# Patient Record
Sex: Male | Born: 1955 | Hispanic: No | Smoking: Current every day smoker
Health system: Southern US, Community
[De-identification: ages and names within clinical notes are randomized; demographics above are authoritative.]

## PROBLEM LIST (undated history)

## (undated) DIAGNOSIS — I10 Essential (primary) hypertension: Secondary | ICD-10-CM

## (undated) DIAGNOSIS — B1920 Unspecified viral hepatitis C without hepatic coma: Secondary | ICD-10-CM

---

## 1997-09-18 ENCOUNTER — Emergency Department (HOSPITAL_COMMUNITY): Admission: EM | Admit: 1997-09-18 | Discharge: 1997-09-18 | Payer: Self-pay | Admitting: Emergency Medicine

## 1999-12-28 ENCOUNTER — Emergency Department (HOSPITAL_COMMUNITY): Admission: EM | Admit: 1999-12-28 | Discharge: 1999-12-28 | Payer: Self-pay | Admitting: Emergency Medicine

## 2001-03-19 ENCOUNTER — Emergency Department (HOSPITAL_COMMUNITY): Admission: EM | Admit: 2001-03-19 | Discharge: 2001-03-19 | Payer: Self-pay | Admitting: Emergency Medicine

## 2003-06-11 ENCOUNTER — Emergency Department (HOSPITAL_COMMUNITY): Admission: AC | Admit: 2003-06-11 | Discharge: 2003-06-11 | Payer: Self-pay

## 2007-02-27 ENCOUNTER — Emergency Department (HOSPITAL_COMMUNITY): Admission: EM | Admit: 2007-02-27 | Discharge: 2007-02-28 | Payer: Self-pay | Admitting: Emergency Medicine

## 2011-03-14 LAB — I-STAT 8, (EC8 V) (CONVERTED LAB)
Acid-base deficit: 2
BUN: 5 — ABNORMAL LOW
Bicarbonate: 25.8 — ABNORMAL HIGH
Chloride: 106
Glucose, Bld: 95
HCT: 48
Hemoglobin: 16.3
Operator id: 192351
Potassium: 3.7
Sodium: 140
TCO2: 27
pCO2, Ven: 52 — ABNORMAL HIGH
pH, Ven: 7.303 — ABNORMAL HIGH

## 2011-03-14 LAB — POCT I-STAT CREATININE
Creatinine, Ser: 0.9
Operator id: 192351

## 2012-01-09 ENCOUNTER — Emergency Department (HOSPITAL_COMMUNITY): Payer: Medicaid Other

## 2012-01-09 ENCOUNTER — Emergency Department (HOSPITAL_COMMUNITY)
Admission: EM | Admit: 2012-01-09 | Discharge: 2012-01-09 | Disposition: A | Discharge status: Home / Self Care | Payer: Medicaid Other | Attending: Emergency Medicine | Admitting: Emergency Medicine

## 2012-01-09 ENCOUNTER — Encounter (HOSPITAL_COMMUNITY): Payer: Self-pay

## 2012-01-09 DIAGNOSIS — S0033XA Contusion of nose, initial encounter: Secondary | ICD-10-CM

## 2012-01-09 DIAGNOSIS — S0003XA Contusion of scalp, initial encounter: Secondary | ICD-10-CM | POA: Insufficient documentation

## 2012-01-09 DIAGNOSIS — F17200 Nicotine dependence, unspecified, uncomplicated: Secondary | ICD-10-CM | POA: Insufficient documentation

## 2012-01-09 DIAGNOSIS — S0083XA Contusion of other part of head, initial encounter: Secondary | ICD-10-CM | POA: Insufficient documentation

## 2012-01-09 MED ORDER — HYDROCODONE-ACETAMINOPHEN 5-325 MG PO TABS: 1.0000 | ORAL_TABLET | Freq: Four times a day (QID) | ORAL | Status: AC | PRN

## 2012-01-09 MED ORDER — NAPROXEN 500 MG PO TABS: 500.0000 mg | ORAL_TABLET | Freq: Two times a day (BID) | ORAL | Status: DC

## 2012-01-09 MED ORDER — HYDROCODONE-ACETAMINOPHEN 5-325 MG PO TABS
1.0000 | ORAL_TABLET | Freq: Once | ORAL | Status: AC
  Administered 2012-01-09: 1 via ORAL
  Filled 2012-01-09: qty 1
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
## 2012-03-28 ENCOUNTER — Emergency Department (HOSPITAL_COMMUNITY)
Admission: EM | Admit: 2012-03-28 | Discharge: 2012-03-28 | Disposition: A | Discharge status: Home / Self Care | Payer: Medicaid Other | Source: Home / Self Care | Attending: Emergency Medicine | Admitting: Emergency Medicine

## 2012-03-28 ENCOUNTER — Encounter (HOSPITAL_COMMUNITY): Payer: Self-pay

## 2012-03-28 ENCOUNTER — Emergency Department (INDEPENDENT_AMBULATORY_CARE_PROVIDER_SITE_OTHER): Payer: Medicaid Other

## 2012-03-28 DIAGNOSIS — M545 Low back pain, unspecified: Secondary | ICD-10-CM

## 2012-03-28 MED ORDER — CYCLOBENZAPRINE HCL 5 MG PO TABS: 5.0000 mg | ORAL_TABLET | Freq: Three times a day (TID) | ORAL | Status: DC | PRN

## 2012-03-28 MED ORDER — ACETAMINOPHEN-CODEINE #3 300-30 MG PO TABS: 1.0000 | ORAL_TABLET | Freq: Four times a day (QID) | ORAL | Status: DC | PRN

## 2012-03-28 NOTE — ED Provider Notes (Signed)
Medical screening examination/treatment/procedure(s) were performed by non-physician practitioner and as supervising physician I was immediately available for consultation/collaboration.  Raynald Blend, MD 03/28/12 [phone]
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
## 2012-04-27 ENCOUNTER — Emergency Department (HOSPITAL_COMMUNITY)
Admission: EM | Admit: 2012-04-27 | Discharge: 2012-04-27 | Disposition: A | Discharge status: Home / Self Care | Payer: Medicaid Other | Attending: Emergency Medicine | Admitting: Emergency Medicine

## 2012-04-27 ENCOUNTER — Emergency Department (HOSPITAL_COMMUNITY): Payer: Medicaid Other

## 2012-04-27 ENCOUNTER — Encounter (HOSPITAL_COMMUNITY): Payer: Self-pay | Admitting: *Deleted

## 2012-04-27 DIAGNOSIS — R062 Wheezing: Secondary | ICD-10-CM

## 2012-04-27 DIAGNOSIS — J029 Acute pharyngitis, unspecified: Secondary | ICD-10-CM

## 2012-04-27 DIAGNOSIS — R05 Cough: Secondary | ICD-10-CM | POA: Insufficient documentation

## 2012-04-27 DIAGNOSIS — R059 Cough, unspecified: Secondary | ICD-10-CM | POA: Insufficient documentation

## 2012-04-27 DIAGNOSIS — R61 Generalized hyperhidrosis: Secondary | ICD-10-CM | POA: Insufficient documentation

## 2012-04-27 DIAGNOSIS — I10 Essential (primary) hypertension: Secondary | ICD-10-CM

## 2012-04-27 DIAGNOSIS — F17200 Nicotine dependence, unspecified, uncomplicated: Secondary | ICD-10-CM | POA: Insufficient documentation

## 2012-04-27 MED ORDER — PANTOPRAZOLE SODIUM 40 MG PO TBEC: 40.0000 mg | DELAYED_RELEASE_TABLET | Freq: Every day | ORAL | Status: DC

## 2012-04-27 MED ORDER — GI COCKTAIL ~~LOC~~
30.0000 mL | Freq: Once | ORAL | Status: AC
  Administered 2012-04-27: 30 mL via ORAL
  Filled 2012-04-27: qty 30

## 2012-04-27 MED ORDER — ALBUTEROL SULFATE HFA 108 (90 BASE) MCG/ACT IN AERS
1.0000 | INHALATION_SPRAY | Freq: Four times a day (QID) | RESPIRATORY_TRACT | Status: DC | PRN
  Administered 2012-04-27: 2 via RESPIRATORY_TRACT
  Filled 2012-04-27: qty 6.7

## 2012-04-27 MED ORDER — PREDNISONE 20 MG PO TABS
40.0000 mg | ORAL_TABLET | Freq: Once | ORAL | Status: AC
  Administered 2012-04-27: 40 mg via ORAL
  Filled 2012-04-27: qty 2

## 2012-04-27 MED ORDER — PREDNISONE 20 MG PO TABS: 40.0000 mg | ORAL_TABLET | Freq: Every day | ORAL | Status: DC

## 2012-04-27 MED ORDER — AMLODIPINE BESYLATE 10 MG PO TABS: 10.0000 mg | ORAL_TABLET | Freq: Every day | ORAL | Status: DC

## 2012-04-27 NOTE — ED Notes (Signed)
Pt is here with throat pain for 3 weeks.  Red on inspection.  Pt states at night he has to clear his throat a lot.

## 2012-04-27 NOTE — Discharge Instructions (Signed)
Arterial Hypertension °Arterial hypertension (high blood pressure) is a condition of elevated pressure in your blood vessels. Hypertension over a long period of time is a risk factor for strokes, heart attacks, and heart failure. It is also the leading cause of kidney (renal) failure.  °CAUSES  °· In Adults -- Over 90% of all hypertension has no known cause. This is called essential or primary hypertension. In the other 10% of people with hypertension, the increase in blood pressure is caused by another disorder. This is called secondary hypertension. Important causes of secondary hypertension are: °· Heavy alcohol use. °· Obstructive sleep apnea. °· Hyperaldosterosim (Conn's syndrome). °· Steroid use. °· Chronic kidney failure. °· Hyperparathyroidism. °· Medications. °· Renal artery stenosis. °· Pheochromocytoma. °· Cushing's disease. °· Coarctation of the aorta. °· Scleroderma renal crisis. °· Licorice (in excessive amounts). °· Drugs (cocaine, methamphetamine). °Your caregiver can explain any items above that apply to you. °· In Children -- Secondary hypertension is more common and should always be considered. °· Pregnancy -- Few women of childbearing age have high blood pressure. However, up to 10% of them develop hypertension of pregnancy. Generally, this will not harm the woman. It may be a sign of 3 complications of pregnancy: preeclampsia, HELLP syndrome, and eclampsia. Follow up and control with medication is necessary. °SYMPTOMS  °· This condition normally does not produce any noticeable symptoms. It is usually found during a routine exam. °· Malignant hypertension is a late problem of high blood pressure. It may have the following symptoms: °· Headaches. °· Blurred vision. °· End-organ damage (this means your kidneys, heart, lungs, and other organs are being damaged). °· Stressful situations can increase the blood pressure. If a person with normal blood pressure has their blood pressure go up while being  seen by their caregiver, this is often termed "white coat hypertension." Its importance is not known. It may be related with eventually developing hypertension or complications of hypertension. °· Hypertension is often confused with mental tension, stress, and anxiety. °DIAGNOSIS  °The diagnosis is made by 3 separate blood pressure measurements. They are taken at least 1 week apart from each other. If there is organ damage from hypertension, the diagnosis may be made without repeat measurements. °Hypertension is usually identified by having blood pressure readings: °· Above 140/90 mmHg measured in both arms, at 3 separate times, over a couple weeks. °· Over 130/80 mmHg should be considered a risk factor and may require treatment in patients with diabetes. °Blood pressure readings over 120/80 mmHg are called "pre-hypertension" even in non-diabetic patients. °To get a true blood pressure measurement, use the following guidelines. Be aware of the factors that can alter blood pressure readings. °· Take measurements at least 1 hour after caffeine. °· Take measurements 30 minutes after smoking and without any stress. This is another reason to quit smoking  it raises your blood pressure. °· Use a proper cuff size. Ask your caregiver if you are not sure about your cuff size. °· Most home blood pressure cuffs are automatic. They will measure systolic and diastolic pressures. The systolic pressure is the pressure reading at the start of sounds. Diastolic pressure is the pressure at which the sounds disappear. If you are elderly, measure pressures in multiple postures. Try sitting, lying or standing. °· Sit at rest for a minimum of 5 minutes before taking measurements. °· You should not be on any medications like decongestants. These are found in many cold medications. °· Record your blood pressure readings and review   them with your caregiver. °If you have hypertension: °· Your caregiver may do tests to be sure you do not have  secondary hypertension (see "causes" above). °· Your caregiver may also look for signs of metabolic syndrome. This is also called Syndrome X or Insulin Resistance Syndrome. You may have this syndrome if you have type 2 diabetes, abdominal obesity, and abnormal blood lipids in addition to hypertension. °· Your caregiver will take your medical and family history and perform a physical exam. °· Diagnostic tests may include blood tests (for glucose, cholesterol, potassium, and kidney function), a urinalysis, or an EKG. Other tests may also be necessary depending on your condition. °PREVENTION  °There are important lifestyle issues that you can adopt to reduce your chance of developing hypertension: °· Maintain a normal weight. °· Limit the amount of salt (sodium) in your diet. °· Exercise often. °· Limit alcohol intake. °· Get enough potassium in your diet. Discuss specific advice with your caregiver. °· Follow a DASH diet (dietary approaches to stop hypertension). This diet is rich in fruits, vegetables, and low-fat dairy products, and avoids certain fats. °PROGNOSIS  °Essential hypertension cannot be cured. Lifestyle changes and medical treatment can lower blood pressure and reduce complications. The prognosis of secondary hypertension depends on the underlying cause. Many people whose hypertension is controlled with medicine or lifestyle changes can live a normal, healthy life.  °RISKS AND COMPLICATIONS  °While high blood pressure alone is not an illness, it often requires treatment due to its short- and long-term effects on many organs. Hypertension increases your risk for: °· CVAs or strokes (cerebrovascular accident). °· Heart failure due to chronically high blood pressure (hypertensive cardiomyopathy). °· Heart attack (myocardial infarction). °· Damage to the retina (hypertensive retinopathy). °· Kidney failure (hypertensive nephropathy). °Your caregiver can explain list items above that apply to you. Treatment  of hypertension can significantly reduce the risk of complications. °TREATMENT  °· For overweight patients, weight loss and regular exercise are recommended. Physical fitness lowers blood pressure. °· Mild hypertension is usually treated with diet and exercise. A diet rich in fruits and vegetables, fat-free dairy products, and foods low in fat and salt (sodium) can help lower blood pressure. Decreasing salt intake decreases blood pressure in a 1/3 of people. °· Stop smoking if you are a smoker. °The steps above are highly effective in reducing blood pressure. While these actions are easy to suggest, they are difficult to achieve. Most patients with moderate or severe hypertension end up requiring medications to bring their blood pressure down to a normal level. There are several classes of medications for treatment. Blood pressure pills (antihypertensives) will lower blood pressure by their different actions. Lowering the blood pressure by 10 mmHg may decrease the risk of complications by as much as 25%. °The goal of treatment is effective blood pressure control. This will reduce your risk for complications. Your caregiver will help you determine the best treatment for you according to your lifestyle. What is excellent treatment for one person, may not be for you. °HOME CARE INSTRUCTIONS  °· Do not smoke. °· Follow the lifestyle changes outlined in the "Prevention" section. °· If you are on medications, follow the directions carefully. Blood pressure medications must be taken as prescribed. Skipping doses reduces their benefit. It also puts you at risk for problems. °· Follow up with your caregiver, as directed. °· If you are asked to monitor your blood pressure at home, follow the guidelines in the "Diagnosis" section above. °SEEK MEDICAL CARE   HOME CARE INSTRUCTIONS     Do not smoke.   Follow the lifestyle changes outlined in the "Prevention" section.   If you are on medications, follow the directions carefully. Blood pressure medications must be taken as prescribed. Skipping doses reduces their benefit. It also puts you at risk for problems.   Follow up with your caregiver, as directed.   If you are asked to monitor your blood pressure at home, follow the guidelines in the "Diagnosis" section above.  SEEK MEDICAL CARE IF:     You think you are having medication side effects.   You have recurrent headaches or lightheadedness.   You have swelling in your ankles.   You  have trouble with your vision.  SEEK IMMEDIATE MEDICAL CARE IF:     You have sudden onset of chest pain or pressure, difficulty breathing, or other symptoms of a heart attack.   You have a severe headache.   You have symptoms of a stroke (such as sudden weakness, difficulty speaking, difficulty walking).  MAKE SURE YOU:     Understand these instructions.   Will watch your condition.   Will get help right away if you are not doing well or get worse.  Document Released: 05/20/2005 Document Revised: 08/12/2011 Document Reviewed: 12/18/2006  [employer] Patient Information 2013 [employer].

## 2012-04-27 NOTE — ED Notes (Signed)
C/O sore throat x 3 weeks. States started with a cold. Has had hoarseness for 2 weeks. Smokes 1ppd cigs and drinks two "40's" per day. States has had difficulty swallowing, especially solids.

## 2012-04-27 NOTE — ED Provider Notes (Addendum)
History    This chart was scribed for Jack King. Oletta Lamas, MD, MD by Smitty Pluck, ED Scribe. The patient was seen in room TR08C and the patient's care was started at 10:56AM.   CSN: [id number]  Arrival date & time 04/27/12  1039   None     Chief Complaint  Patient presents with  . sorethroat     (Consider location/radiation/quality/duration/timing/severity/associated sxs/prior treatment) The history is provided by the patient. No language interpreter was used.   Jack King is a 56 y.o. male who presents to the Emergency Department complaining of constant, moderate sore throat onset 3 weeks ago. Pt reports at night the pain is worse. He states that he coughs up phlegm sometimes. He states that he gets associated chest pain. Chest pain is not affected by exertion. He denies hx of acid reflux and reports that he normally eats spicy food. Swallowing aggravates the pain. He reports that he has diaphoresis at night and mentions having hoarseness. Reports using throat spray with minor relief. He denies fever, chills, nausea, vomiting, diarrhea, weight loss and any other pain. Pt states that he smokes cigarettes.   History reviewed. No pertinent past medical history.  History reviewed. No pertinent past surgical history.  History reviewed. No pertinent family history.  History  Substance Use Topics  . Smoking status: Current Every Day Smoker -- 2.0 packs/day  . Smokeless tobacco: Not on file  . Alcohol Use: Yes     Comment: daily      Review of Systems  Constitutional: Positive for diaphoresis. Negative for fever, chills and unexpected weight change.  HENT: Positive for sore throat, trouble swallowing and voice change. Negative for ear pain, congestion, rhinorrhea and sinus pressure.   Respiratory: Positive for cough and chest tightness. Negative for shortness of breath.   Cardiovascular: Positive for chest pain. Negative for palpitations and leg swelling.  Gastrointestinal:  Negative for nausea, vomiting and diarrhea.  Musculoskeletal: Negative for back pain.  Neurological: Negative for weakness.  All other systems reviewed and are negative.    Allergies  Review of patient's allergies indicates no known allergies.  Home Medications   Current Outpatient Rx  Name  Route  Sig  Dispense  Refill  . PHENOL 1.4 % MT LIQD   Mouth/Throat   Use as directed 2 sprays in the mouth or throat as needed. For sore throat         . AMLODIPINE BESYLATE 10 MG PO TABS   Oral   Take 1 tablet (10 mg total) by mouth daily.   30 tablet   0   . PANTOPRAZOLE SODIUM 40 MG PO TBEC   Oral   Take 1 tablet (40 mg total) by mouth daily.   14 tablet   0   . PREDNISONE 20 MG PO TABS   Oral   Take 2 tablets (40 mg total) by mouth daily.   14 tablet   0     BP 162/78  Pulse 68  Temp 97.4 F (36.3 C) (Oral)  Resp 18  SpO2 100%  Physical Exam  Nursing note and vitals reviewed. Constitutional: He is oriented to person, place, and time. He appears well-developed and well-nourished. No distress.  HENT:  Head: Normocephalic and atraumatic.  Right Ear: External ear normal.  Left Ear: External ear normal.  Mouth/Throat: Uvula is midline, oropharynx is clear and moist and mucous membranes are normal. No oropharyngeal exudate.       Generalized erythema likely due to irritation No  maxilla or frontal sinus tenderness  No edema No masses  No lesions  Eyes: EOM are normal.  Neck: Neck supple. No tracheal deviation present. No thyromegaly present.  Cardiovascular: Normal rate.   Pulmonary/Chest: Effort normal. No respiratory distress. He has wheezes in the right upper field.  Musculoskeletal: Normal range of motion.  Lymphadenopathy:    He has no cervical adenopathy.  Neurological: He is alert and oriented to person, place, and time.  Skin: Skin is warm and dry.  Psychiatric: He has a normal mood and affect. His behavior is normal.    ED Course  Procedures  (including critical care time) DIAGNOSTIC STUDIES: Oxygen Saturation is 99% on room air, normal by my interpretation.    COORDINATION OF CARE: 11:01 AM Discussed ED treatment with pt  11:01 AM Ordered:     . [COMPLETED] gi cocktail  30 mL Oral Once  . [COMPLETED] predniSONE  40 mg Oral Once       Labs Reviewed - No data to display Dg Chest 2 View  04/27/2012  *RADIOLOGY REPORT*  Clinical Data: Chest pain and throat pain.  CHEST - 2 VIEW  Comparison: Rib exam 02/27/2007  Findings: Two views of the chest demonstrate multiple old left rib fractures.  The lungs are clear bilaterally. Heart and mediastinum are within normal limits.  The trachea is midline.  IMPRESSION: No acute cardiopulmonary disease.  Old left rib fractures.   Original Report Authenticated By: Richarda Overlie, M.D.      1. Sore throat   2. Wheezing   3. Hypertension    12:43 PM CXR is clear, no masses, infiltrates, lesions per radiologist.  I reviewed 2 view CXR myself and interpret as no pneumonia, no cardiac border enlargement, no free air.    Will start on BP meds, steroids for inflammation which should help any slight wheeze as well as pharyngitis.  History is not consistent with strep or infection.    I reviewed several prior ED notes and consistently pt would have BP in the 150/90 range.  Today, has continued to get worse, will start on 10 mg norvasc and pt is told to recheck this with Dr. Concepcion Elk next week.   MDM  Pt with sore throat, hurts worse with swallowing, goes into his chest and feels worse at night time, has to clear phlegm a lot.  Pt smokes, endorses some night sweats, but no loss of appetite, unusual fatigue or weight loss.  No exertional CP or SOB, and symptoms not worse with exertion.  Doubt cardiac.  Oropharynx is somewhat irritated as well.  I suspect GERD or chronic cough may be contributing to symptoms.  Pt urged to stop smoking.    Will check CXR, treat for GERD and provide steroids and inhaler  for symptoms.      I personally performed the services described in this documentation, which was scribed in my presence. The recorded information has been reviewed and is accurate.     Jack King. Oletta Lamas, MD 04/27/12 1244  Jack King. Oletta Lamas, MD 04/27/12 1245  Jack King. Estil Vallee, MD 04/27/12 1246

## 2012-10-26 ENCOUNTER — Emergency Department (HOSPITAL_COMMUNITY)
Admission: EM | Admit: 2012-10-26 | Discharge: 2012-10-26 | Disposition: A | Discharge status: Home / Self Care | Payer: Medicaid Other | Attending: Emergency Medicine | Admitting: Emergency Medicine

## 2012-10-26 ENCOUNTER — Emergency Department (HOSPITAL_COMMUNITY): Payer: Medicaid Other

## 2012-10-26 DIAGNOSIS — Y998 Other external cause status: Secondary | ICD-10-CM | POA: Insufficient documentation

## 2012-10-26 DIAGNOSIS — Y92410 Unspecified street and highway as the place of occurrence of the external cause: Secondary | ICD-10-CM | POA: Insufficient documentation

## 2012-10-26 DIAGNOSIS — S51811A Laceration without foreign body of right forearm, initial encounter: Secondary | ICD-10-CM

## 2012-10-26 DIAGNOSIS — F17200 Nicotine dependence, unspecified, uncomplicated: Secondary | ICD-10-CM | POA: Insufficient documentation

## 2012-10-26 DIAGNOSIS — F1010 Alcohol abuse, uncomplicated: Secondary | ICD-10-CM | POA: Insufficient documentation

## 2012-10-26 DIAGNOSIS — Z23 Encounter for immunization: Secondary | ICD-10-CM | POA: Insufficient documentation

## 2012-10-26 DIAGNOSIS — S51809A Unspecified open wound of unspecified forearm, initial encounter: Secondary | ICD-10-CM | POA: Insufficient documentation

## 2012-10-26 DIAGNOSIS — F10929 Alcohol use, unspecified with intoxication, unspecified: Secondary | ICD-10-CM

## 2012-10-26 DIAGNOSIS — Z79899 Other long term (current) drug therapy: Secondary | ICD-10-CM | POA: Insufficient documentation

## 2012-10-26 MED ORDER — TETANUS-DIPHTH-ACELL PERTUSSIS 5-2.5-18.5 LF-MCG/0.5 IM SUSP
0.5000 mL | Freq: Once | INTRAMUSCULAR | Status: AC
  Administered 2012-10-26: 0.5 mL via INTRAMUSCULAR
  Filled 2012-10-26: qty 0.5

## 2012-10-26 MED ORDER — NAPROXEN 500 MG PO TABS: 500.0000 mg | ORAL_TABLET | Freq: Two times a day (BID) | ORAL | Status: DC

## 2012-10-26 MED ORDER — BACITRACIN 500 UNIT/GM EX OINT
1.0000 "application " | TOPICAL_OINTMENT | Freq: Two times a day (BID) | CUTANEOUS | Status: DC
  Filled 2012-10-26: qty 0.9

## 2012-10-26 NOTE — ED Notes (Signed)
MD Hyacinth Meeker at bedside performing sutures. Pt tolerating well. NAD.

## 2012-10-26 NOTE — ED Notes (Signed)
Pt states that son is coming to pick him up. Nurse first made aware. Pt will be placed in nurses sight to ensure that he doesn't attempt to drive. Pt made aware that he cannot drive due to being intoxicated. Pt reports "I don't even have anything to drive"

## 2012-10-26 NOTE — ED Notes (Signed)
Bacitracin applied to site, sterile guaze applied and site wrapped. Pt continues to deny pain. Follow up care provided.
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
## 2013-03-12 IMAGING — CR DG CHEST 2V
2 series · 2 of 2 positions shown · non-contrast
Comparison: Rib exam 02/27/2007

CLINICAL DATA: Chest pain and throat pain.

CHEST - 2 VIEW

[w chest pa]
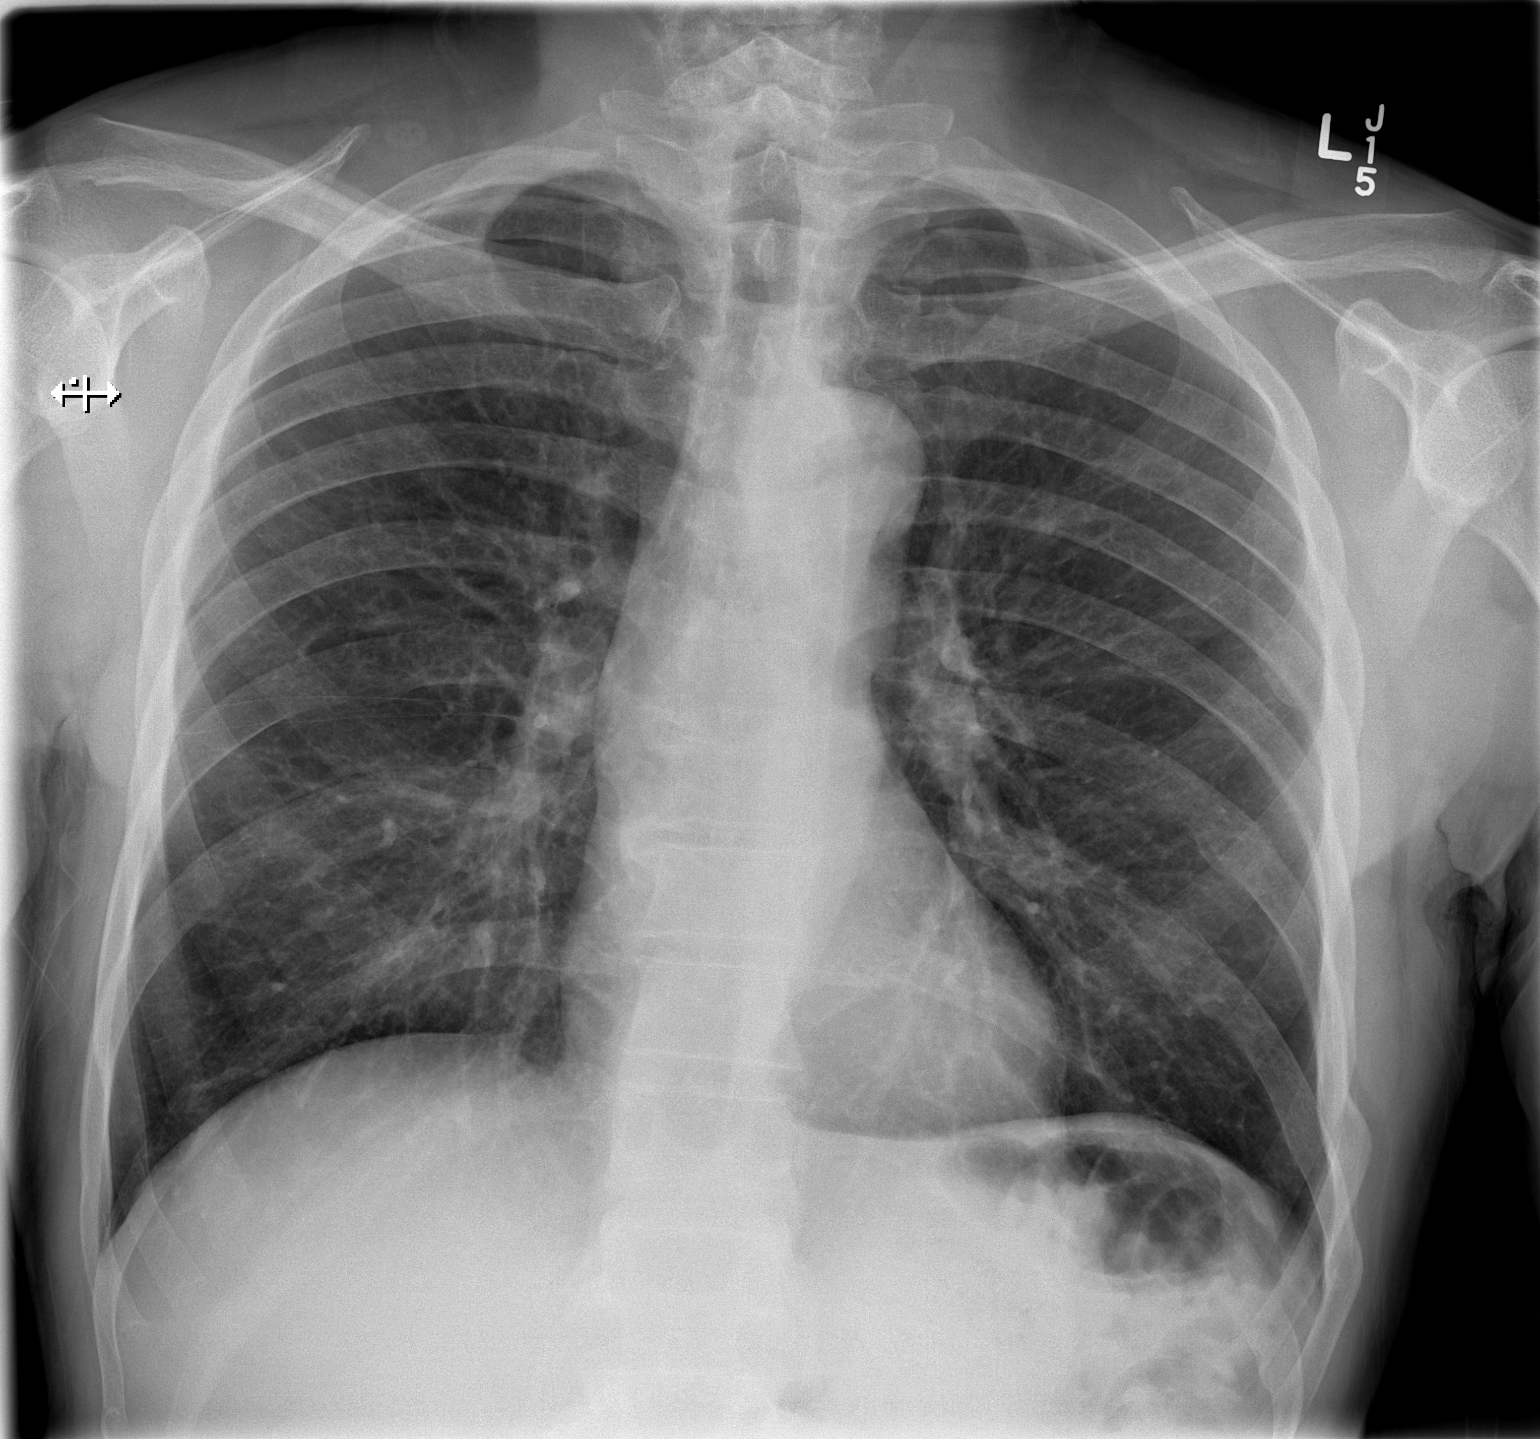

[w chest lat]
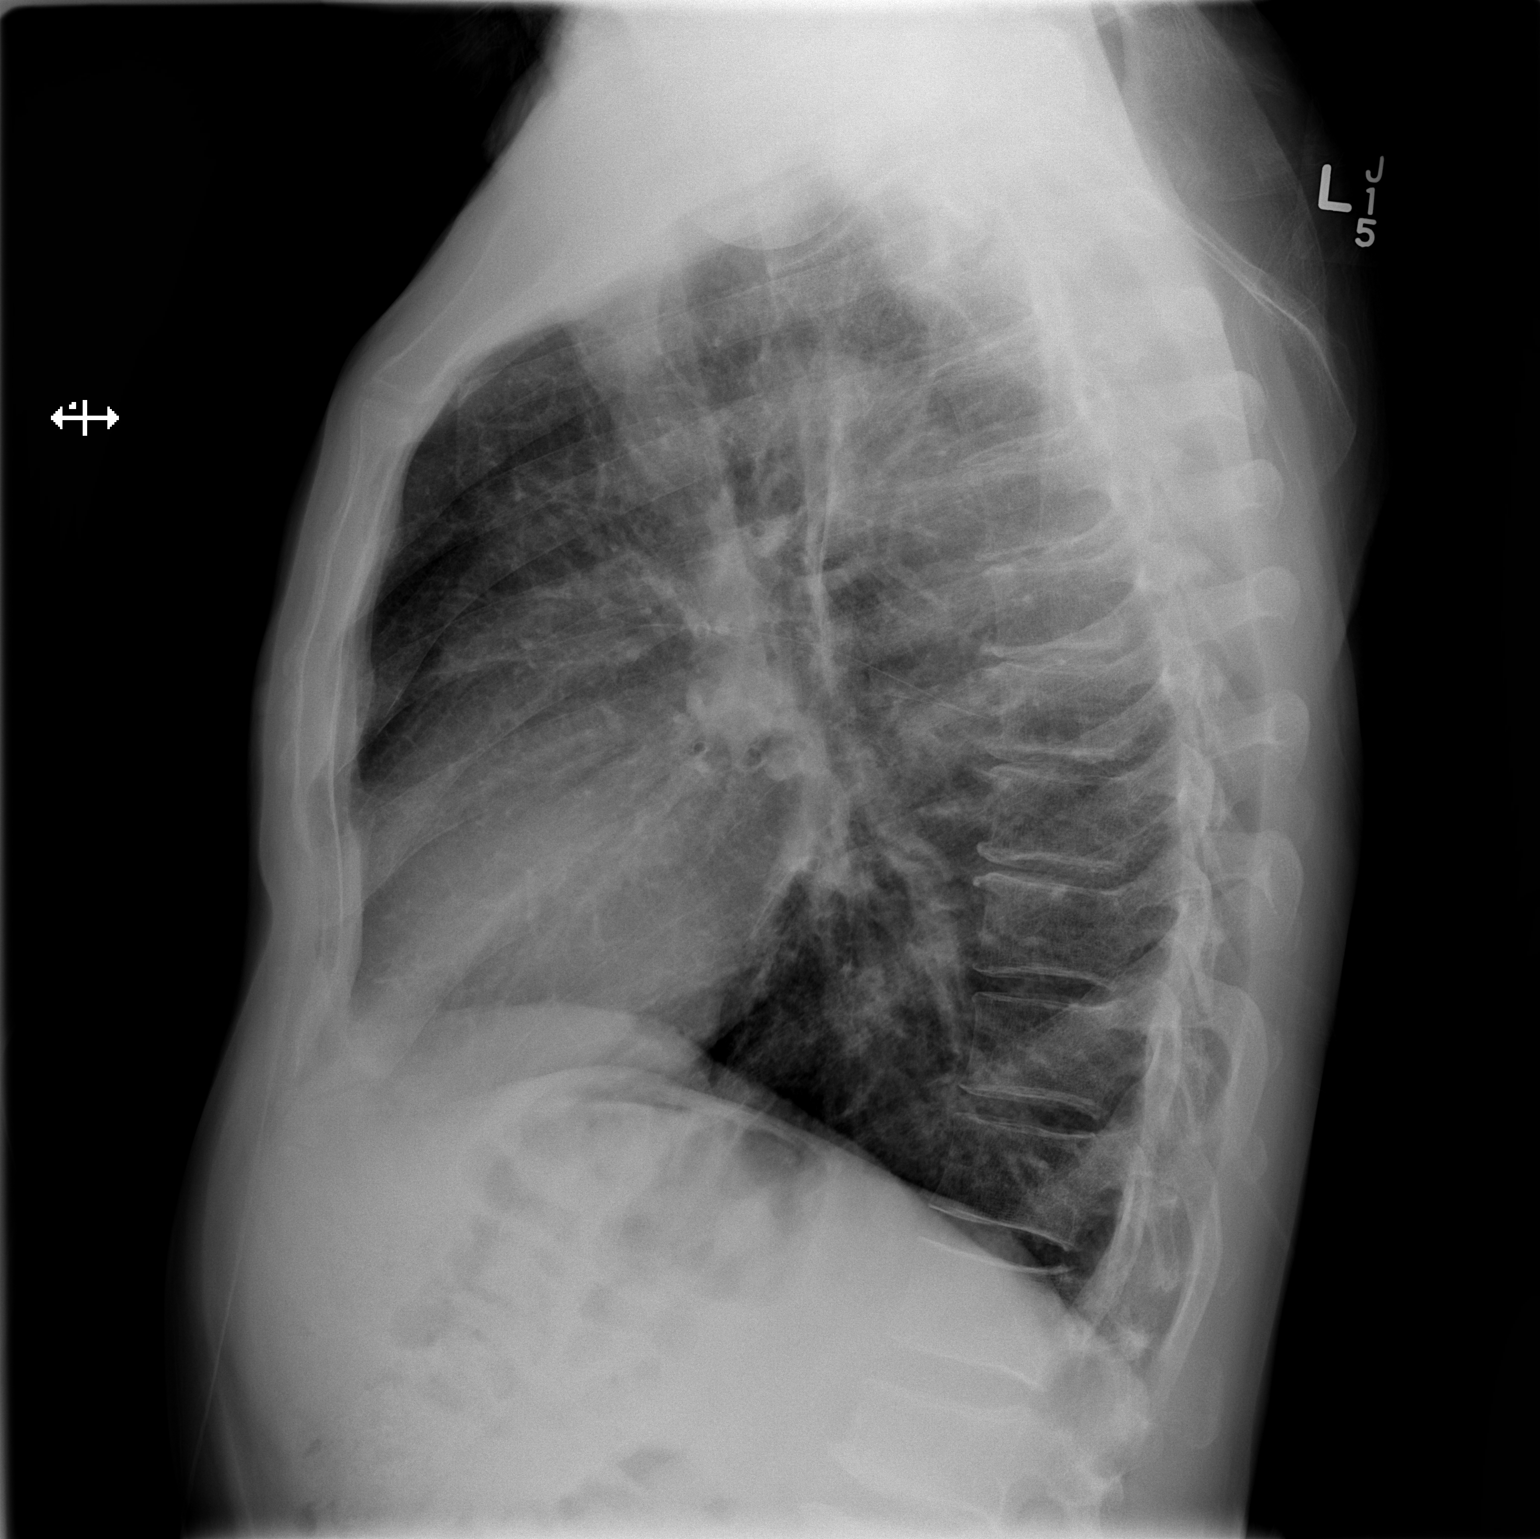

[2 of 2 positions shown; findings below may reference images not displayed]

FINDINGS: Two views of the chest demonstrate multiple old left rib
fractures.  The lungs are clear bilaterally. Heart and mediastinum
are within normal limits.  The trachea is midline.
IMPRESSION: No acute cardiopulmonary disease.

Old left rib fractures.

## 2013-07-22 ENCOUNTER — Emergency Department (HOSPITAL_COMMUNITY)
Admission: EM | Admit: 2013-07-22 | Discharge: 2013-07-22 | Disposition: A | Discharge status: Home / Self Care | Payer: Medicaid Other | Attending: Emergency Medicine | Admitting: Emergency Medicine

## 2013-07-22 ENCOUNTER — Other Ambulatory Visit: Payer: Self-pay

## 2013-07-22 ENCOUNTER — Encounter (HOSPITAL_COMMUNITY): Payer: Self-pay | Admitting: Emergency Medicine

## 2013-07-22 ENCOUNTER — Emergency Department (HOSPITAL_COMMUNITY): Payer: Medicaid Other

## 2013-07-22 DIAGNOSIS — I10 Essential (primary) hypertension: Secondary | ICD-10-CM | POA: Insufficient documentation

## 2013-07-22 DIAGNOSIS — R0789 Other chest pain: Secondary | ICD-10-CM

## 2013-07-22 DIAGNOSIS — Z79899 Other long term (current) drug therapy: Secondary | ICD-10-CM | POA: Insufficient documentation

## 2013-07-22 DIAGNOSIS — F17200 Nicotine dependence, unspecified, uncomplicated: Secondary | ICD-10-CM | POA: Insufficient documentation

## 2013-07-22 DIAGNOSIS — R0602 Shortness of breath: Secondary | ICD-10-CM | POA: Insufficient documentation

## 2013-07-22 DIAGNOSIS — R071 Chest pain on breathing: Secondary | ICD-10-CM | POA: Insufficient documentation

## 2013-07-22 LAB — COMPREHENSIVE METABOLIC PANEL
ALT: 35 U/L (ref 0–53)
AST: 36 U/L (ref 0–37)
Albumin: 4.1 g/dL (ref 3.5–5.2)
Alkaline Phosphatase: 90 U/L (ref 39–117)
BILIRUBIN TOTAL: 0.3 mg/dL (ref 0.3–1.2)
BUN: 8 mg/dL (ref 6–23)
CHLORIDE: 99 meq/L (ref 96–112)
CO2: 25 meq/L (ref 19–32)
CREATININE: 0.65 mg/dL (ref 0.50–1.35)
Calcium: 9.6 mg/dL (ref 8.4–10.5)
GLUCOSE: 82 mg/dL (ref 70–99)
Potassium: 4.1 mEq/L (ref 3.7–5.3)
Sodium: 139 mEq/L (ref 137–147)
Total Protein: 8.2 g/dL (ref 6.0–8.3)

## 2013-07-22 LAB — CBC
HEMATOCRIT: 45.8 % (ref 39.0–52.0)
Hemoglobin: 16.3 g/dL (ref 13.0–17.0)
MCH: 34 pg (ref 26.0–34.0)
MCHC: 35.6 g/dL (ref 30.0–36.0)
MCV: 95.4 fL (ref 78.0–100.0)
Platelets: 222 10*3/uL (ref 150–400)
RBC: 4.8 MIL/uL (ref 4.22–5.81)
RDW: 13.6 % (ref 11.5–15.5)
WBC: 6.4 10*3/uL (ref 4.0–10.5)

## 2013-07-22 LAB — I-STAT TROPONIN, ED: TROPONIN I, POC: 0 ng/mL (ref 0.00–0.08)

## 2013-07-22 NOTE — ED Provider Notes (Signed)
CSN: [id number]     Arrival date & time 07/22/13  1423 History   None    Chief Complaint  Patient presents with  . Chest Pain    HPI Comments: 58 yo M hx of HTN, 30 pack/year smoking hx come in with CC of chest pain.  Pt states symptoms started 2 days ago.  Pain is sharp, left sided, nonradiating, mild, lasting only a few seconds, resolves on own, with associated mild SOB.  Worse when laying on left side, and is nonpleuritic in nature.  Pt denies previous occurrence.  He denies fever, chills, diaphoresis, cough, abdominal pain, nausea, vomiting, diarrhea, constipation, myalgias, rash, or any other symptoms.  Pt is currently asymptomatic.  He takes 2 full ASA daily.  Has not really tried any medications for this pain specifically.  Denies hx of CAD, HLD, DMII.  Pt does has FMx of cardiac disease in mom, but not premature.  No other complaints currently.  The history is provided by the patient. No language interpreter was used.    History reviewed. No pertinent past medical history. History reviewed. No pertinent past surgical history. No family history on file. History  Substance Use Topics  . Smoking status: Current Every Day Smoker -- 2.00 packs/day  . Smokeless tobacco: Not on file  . Alcohol Use: Yes     Comment: daily    Review of Systems  Constitutional: Negative for fever, chills and diaphoresis.  Respiratory: Positive for shortness of breath. Negative for cough, wheezing and stridor.   Cardiovascular: Positive for chest pain. Negative for palpitations and leg swelling.  Gastrointestinal: Negative for nausea, vomiting, abdominal pain and diarrhea.  Musculoskeletal: Negative for myalgias.  Skin: Negative for rash.  Neurological: Negative for dizziness, weakness, light-headedness, numbness and headaches.  Hematological: Negative for adenopathy. Does not bruise/bleed easily.  All other systems reviewed and are negative.      Allergies  Review of patient's allergies indicates  no known allergies.  Home Medications   Current Outpatient Rx  Name  Route  Sig  Dispense  Refill  . amLODipine (NORVASC) 10 MG tablet   Oral   Take 1 tablet (10 mg total) by mouth daily.   30 tablet   0   . naproxen (NAPROSYN) 500 MG tablet   Oral   Take 1 tablet (500 mg total) by mouth 2 (two) times daily with a meal.   30 tablet   0    BP 143/86  Pulse 72  Temp(Src) 97.5 F (36.4 C)  Resp 18  SpO2 97% Physical Exam  Nursing note and vitals reviewed. Constitutional: He is oriented to person, place, and time. He appears well-developed and well-nourished.  HENT:  Head: Normocephalic and atraumatic.  Right Ear: External ear normal.  Left Ear: External ear normal.  Nose: Nose normal.  Mouth/Throat: Oropharynx is clear and moist.  Eyes: Conjunctivae and EOM are normal. Pupils are equal, round, and reactive to light.  Neck: Normal range of motion. Neck supple.  Cardiovascular: Normal rate, regular rhythm, normal heart sounds and intact distal pulses.   Pulmonary/Chest: Effort normal and breath sounds normal. No respiratory distress. He has no wheezes. He has no rales. He exhibits no tenderness.  Abdominal: Soft. Bowel sounds are normal. He exhibits no distension and no mass. There is no tenderness. There is no rebound and no guarding.  Musculoskeletal: Normal range of motion.  Neurological: He is alert and oriented to person, place, and time.  Skin: Skin is warm and dry.  ED Course  Procedures (including critical care time) Labs Review Labs Reviewed  CBC  COMPREHENSIVE METABOLIC PANEL  I-STAT TROPOININ, ED   Imaging Review Dg Chest 2 View  07/22/2013   CLINICAL DATA:  Left side chest pain and shortness of breath for 2 days, history smoking, hypertension  EXAM: CHEST  2 VIEW  COMPARISON:  04/27/2012  FINDINGS: Normal heart size, mediastinal contours, and pulmonary vascularity.  Minimal peribronchial thickening and hyperinflation.  No acute infiltrate, pleural  effusion or pneumothorax.  Old lower lateral left rib fractures noted.  IMPRESSION: Minimal chronic bronchitic changes.  No acute abnormalities.   Electronically Signed   By: Ulyses Southward M.D.   On: 07/22/2013 14:52    EKG Interpretation    Date/Time:  Thursday July 22 2013 14:28:39 EST Ventricular Rate:  71 PR Interval:  184 QRS Duration: 84 QT Interval:  400 QTC Calculation: 434 R Axis:   43 Text Interpretation:  Normal sinus rhythm Septal infarct , age undetermined Abnormal ECG Confirmed by Bebe Shaggy  MD, DONALD [phone]) on 07/22/2013 3:12:15 PM            MDM   Final diagnoses:  None   58 yo M hx of HTN, 30 pack/year smoking hx come in with CC of chest pain.   Filed Vitals:   07/22/13 1501  BP: 146/80  Pulse:   Temp:   Resp: 20   Physical exam as above.  Lungs CTAB, no wheezing, rales, or rhonchi.  No cardiac murmurs.  Regular rate and rhythm.  No reproducible chest TTP.  No abdominal pain.  Radial and femoral pulses equal bilaterally.  EKG as above, NSR, unconcerning for ACS.  CXR minimal peribronchial thickening, old left lateral rib fractures.  CBC, CMP are both WNL.  Troponin 0.00.    Unlikely ACS, given atypical history, presentation.    Pt to be d/c home in good condition.  Encouraged to continue supportive care.  F/u with PCP in 1 week.  Return precautions given.  Pt understands and agrees with plan.  I have discussed pt's care plan with Dr. Bebe Shaggy.  Jon Gills, MD     Jon Gills, MD 07/23/13 Lyda Jester

## 2013-07-22 NOTE — ED Notes (Signed)
Pt c/o intermittent left lower chest pain, started 2 night ago. Reports he was laying down when it started. Denies feeling lightheaded/dizzy/nauseous with it. sts he did experience some sob. Pt denies pain at this time. Reports having about 10 episodes last night, each lasts approximately 3 mins at the longest. sts the pain goes away on its own. Nad, skin warm and dry, resp e/u.

## 2013-07-22 NOTE — ED Notes (Signed)
Pt returned from radiology.

## 2013-07-22 NOTE — Discharge Instructions (Signed)

## 2013-07-22 NOTE — ED Notes (Signed)
Left side cp x 3 days  Happens at night esp when he lays on left side some  sob

## 2013-07-23 NOTE — ED Provider Notes (Signed)
I have personally seen and examined the patient.  I have discussed the plan of care with the resident.  I have reviewed the documentation on PMH/FH/Soc. History.  I have reviewed the documentation of the resident and agree.  I have reviewed and agree with the ECG interpretation(s) documented by the resident.   Pt well appearing, no distress, he describes to me brief CP, suspicion for ACS/Pe/Dissection is low, stable for d/c home   Joya Gaskinsonald W Verne Lanuza, MD 07/23/13 1004

## 2013-10-10 ENCOUNTER — Emergency Department (HOSPITAL_COMMUNITY): Payer: Medicaid Other

## 2013-10-10 ENCOUNTER — Emergency Department (HOSPITAL_COMMUNITY)
Admission: EM | Admit: 2013-10-10 | Discharge: 2013-10-11 | Disposition: A | Discharge status: Home / Self Care | Payer: Medicaid Other | Attending: Emergency Medicine | Admitting: Emergency Medicine

## 2013-10-10 ENCOUNTER — Encounter (HOSPITAL_COMMUNITY): Payer: Self-pay | Admitting: Emergency Medicine

## 2013-10-10 DIAGNOSIS — Y939 Activity, unspecified: Secondary | ICD-10-CM | POA: Insufficient documentation

## 2013-10-10 DIAGNOSIS — R0781 Pleurodynia: Secondary | ICD-10-CM

## 2013-10-10 DIAGNOSIS — J449 Chronic obstructive pulmonary disease, unspecified: Secondary | ICD-10-CM | POA: Insufficient documentation

## 2013-10-10 DIAGNOSIS — S298XXA Other specified injuries of thorax, initial encounter: Secondary | ICD-10-CM | POA: Insufficient documentation

## 2013-10-10 DIAGNOSIS — F17200 Nicotine dependence, unspecified, uncomplicated: Secondary | ICD-10-CM | POA: Insufficient documentation

## 2013-10-10 DIAGNOSIS — Z7982 Long term (current) use of aspirin: Secondary | ICD-10-CM | POA: Insufficient documentation

## 2013-10-10 DIAGNOSIS — W1809XA Striking against other object with subsequent fall, initial encounter: Secondary | ICD-10-CM | POA: Insufficient documentation

## 2013-10-10 DIAGNOSIS — J4489 Other specified chronic obstructive pulmonary disease: Secondary | ICD-10-CM | POA: Insufficient documentation

## 2013-10-10 DIAGNOSIS — I10 Essential (primary) hypertension: Secondary | ICD-10-CM | POA: Insufficient documentation

## 2013-10-10 DIAGNOSIS — Z79899 Other long term (current) drug therapy: Secondary | ICD-10-CM | POA: Insufficient documentation

## 2013-10-10 DIAGNOSIS — Y929 Unspecified place or not applicable: Secondary | ICD-10-CM | POA: Insufficient documentation

## 2013-10-10 HISTORY — DX: Essential (primary) hypertension: I10

## 2013-10-10 MED ORDER — ACETAMINOPHEN 325 MG PO TABS
650.0000 mg | ORAL_TABLET | Freq: Once | ORAL | Status: AC
  Administered 2013-10-11: 650 mg via ORAL
  Filled 2013-10-10: qty 2
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
## 2013-10-10 NOTE — ED Notes (Signed)
Gave report to Melissa
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
## 2013-10-10 NOTE — Discharge Instructions (Signed)
Take Tylenol or Ibuprofen for pain Continue deep breathing  Return to the emergency department if you develop any changing/worsening condition, chest pain, difficulty breathing, coughing up blood, fever or any other concerns (please read additional information regarding your condition below)     Rib Contusion A rib contusion (bruise) can occur by a blow to the chest or by a fall against a hard object. Usually these will be much better in a couple weeks. If X-rays were taken today and there are no broken bones (fractures), the diagnosis of bruising is made. However, broken ribs may not show up for several days, or may be discovered later on a routine X-ray when signs of healing show up. If this happens to you, it does not mean that something was missed on the X-ray, but simply that it did not show up on the first X-rays. Earlier diagnosis will not usually change the treatment. HOME CARE INSTRUCTIONS   Avoid strenuous activity. Be careful during activities and avoid bumping the injured ribs. Activities that pull on the injured ribs and cause pain should be avoided, if possible.  For the first day or two, an ice pack used every 20 minutes while awake may be helpful. Put ice in a plastic bag and put a towel between the bag and the skin.  Eat a normal, well-balanced diet. Drink plenty of fluids to avoid constipation.  Take deep breaths several times a day to keep lungs free of infection. Try to cough several times a day. Splint the injured area with a pillow while coughing to ease pain. Coughing can help prevent pneumonia.  Wear a rib belt or binder only if told to do so by your caregiver. If you are wearing a rib belt or binder, you must do the breathing exercises as directed by your caregiver. If not used properly, rib belts or binders restrict breathing which can lead to pneumonia.  Only take over-the-counter or prescription medicines for pain, discomfort, or fever as directed by your  caregiver. SEEK MEDICAL CARE IF:   You or your child has an oral temperature above 102 F (38.9 C).  Your baby is older than 3 months with a rectal temperature of 100.5 F (38.1 C) or higher for more than 1 day.  You develop a cough, with thick or bloody sputum. SEEK IMMEDIATE MEDICAL CARE IF:   You have difficulty breathing.  You feel sick to your stomach (nausea), have vomiting or belly (abdominal) pain.  You have worsening pain, not controlled with medications, or there is a change in the location of the pain.  You develop sweating or radiation of the pain into the arms, jaw or shoulders, or become light headed or faint.  You or your child has an oral temperature above 102 F (38.9 C), not controlled by medicine.  Your or your baby is older than 3 months with a rectal temperature of 102 F (38.9 C) or higher.  Your baby is 20 months old or younger with a rectal temperature of 100.4 F (38 C) or higher. MAKE SURE YOU:   Understand these instructions.  Will watch your condition.  Will get help right away if you are not doing well or get worse. Document Released: 02/12/2001 Document Revised: 09/14/2012 Document Reviewed: 01/06/2008 [facility] Patient Information 2014 [place], [place].   Emergency Department Resource Guide 1) Find a Doctor and Pay Out of Pocket Although you won't have to find out who is covered by your insurance plan, it is a good idea to  ask around and get recommendations. You will then need to call the office and see if the doctor you have chosen will accept you as a new patient and what types of options they offer for patients who are self-pay. Some doctors offer discounts or will set up payment plans for their patients who do not have insurance, but you will need to ask so you aren't surprised when you get to your appointment.  2) Contact Your Local Health Department Not all health departments have doctors that can see patients for sick visits, but many do,  so it is worth a call to see if yours does. If you don't know where your local health department is, you can check in your phone book. The CDC also has a tool to help you locate your state's health department, and many state websites also have listings of all of their local health departments.  3) Find a Walk-in Clinic If your illness is not likely to be very severe or complicated, you may want to try a walk in clinic. These are popping up all over the country in pharmacies, drugstores, and shopping centers. They're usually staffed by nurse practitioners or physician assistants that have been trained to treat common illnesses and complaints. They're usually fairly quick and inexpensive. However, if you have serious medical issues or chronic medical problems, these are probably not your best option.  No Primary Care Doctor: - Call Health Connect at  [phone] - they can help you locate a primary care doctor that  accepts your insurance, provides certain services, etc. - Physician Referral Service- [phone]  Chronic Pain Problems: Organization         Address  Phone   Notes  [facility]  [phone] Patients need to be referred by their primary care doctor.   Medication Assistance: Organization         Address  Phone   Notes  [facility] Medication [facility] [address] [place], [place] [address] [phone] --Must be a resident of [facility] -- Must have NO insurance coverage whatsoever (no Medicaid/ Medicare, etc.) -- The pt. MUST have a primary care doctor that directs their care regularly and follows them in the community   Med[facility]ist  [phone]   [employer]  [phone]    Agencies that provide inexpensive medical care: Organization         Address  Phone   Notes  [facility]  [phone]   [facility]    [phone]   [facility] [address] [place], [place] [address] [phone]   [facility] [address], [place] [phone]   Planned Parenthood    [phone]   [facility]    [phone]   Community Health and [facility]  [address], [place] Phone:  [phone], Fax:  [phone] Hours of Operation:  9 am - 6 pm, M-F.  Also accepts Medicaid/Medicare and self-pay.  [facility]  [address], [place] Phone: [phone], Fax: [phone]. Hours of Operation:  8:30 am - 5:30 pm, M-F.  Also accepts Medicaid and self-pay.  [facility] [place] [address] [place] Phone: [phone]   [facility] [address] Natasha Bence [place], [place] [phone], Ext. [phone] Mondays & Thursdays: 7-9 AM.  First 15 patients are  seen on a first come, first serve basis.    Medicaid-accepting [facility] Providers:  Organization         Address  Phone   Notes  [facility] [address], [place] [phone] Also accepts self-pay patients.  [facility] [address] [place], [place]  [phone]   [facility] [address], [place] [phone]) [phone]   [facility] [address] [place] [address], [place] [phone]) [phone]   Renaye RakersVeita Bland [address], [place]   [phone]) [phone] Only accepts [place] Access [place] patients after they have their name applied to their card.   Self-Pay (no insurance) in [facility]:  Organization         Address  Phone   Notes  Sickle Cell Patients, [facility] [address], [place] [phone]) [phone]   [facility] [address], [place] [phone]   [facility]  [address],  [phone]   [facility]/Dr. Osei-Bonsu  [address] [place] [address], [place] or  [phone], [place] [phone]) [phone] Phone number for both [place] and [place] locations is the same.  [facility] [address], [place] [phone]) [phone]   [facility] [place] [address] [place] [address], [place] or [address] [phone]) [phone]   [facility] [address], [place] [phone]) [phone], phone; [phone], fax Sees patients 1st and 3rd Saturday of every month.  Must not qualify for public or private insurance (i.e. Medicaid, Medicare, [place] Health Choice, Veterans' Benefits)  Household income should be no more than 200% of the poverty level The clinic cannot treat you if you are pregnant or think you are pregnant  Sexually transmitted diseases are not treated at the clinic.    Dental Care: Organization         Address  Phone  Notes  [facility] of [facility] [address], [place] [phone]) [phone] Accepts children up to age 57 who are enrolled in [place] or [place] Health Choice; pregnant women with a Medicaid card; and children who have applied for Medicaid or [place] Health Choice, but were declined, whose parents can pay a reduced fee at time of service.  [facility] Department of [facility] [place]  [address], [place] [phone]) [phone] Accepts children up to age 57 who are enrolled in [place] or [place] Health Choice; pregnant women with a Medicaid card; and children who have applied for Medicaid or [place] Health Choice, but were declined, whose parents can pay a reduced fee at time of service.  [facility]  [address], [place] [phone] Patients are seen by appointment only. Walk-ins are not accepted. [facility] will see patients 57 years of age and older. Monday - Tuesday (8am-5pm) Most Wednesdays (8:30-5pm) $30 per visit, cash only  [facility]  [address], [facility]  [place] [phone] Patients are seen by appointment only. Walk-ins are not accepted. [facility] will see patients 57 years of age and older. One Wednesday Evening (Monthly: Volunteer Based).  $30 per visit, cash only  [employer]  [phone]) [phone] for adults; Children under age 374, call Graduate Pediatric Dentistry at [phone]  [phone]. Children aged 73-14, please call [phone] to request a pediatric application.  Dental services are provided in all areas of dental care including fillings, crowns and bridges, complete and partial dentures, implants, gum treatment, root canals, and extractions. Preventive care is also provided. Treatment is provided to both adults and children. Patients are selected via a lottery and there is often a waiting list.   [facility] [address] [place]  [phone] [email or web address]   [facility] [address] [place], [place] [phone], Ext. [phone] Second and Fourth Thursday of each month, opens at 6:30 AM; Clinic ends at 9 AM.  Patients are seen on a first-come first-served basis, and a limited number are seen during each clinic.   [facility]  [address] [place] [place], [place] [phone]   Eligibility Requirements You must have lived in [place], [place], or [place] counties for at least the last three months.   You cannot be eligible for state or federal sponsored [place], including [employer], [place], or [employer].   You generally cannot be eligible for healthcare insurance through your employer.    How to apply: Eligibility screenings are held every Tuesday and Wednesday afternoon from 1:00 pm until 4:00 pm. You do not need an appointment for the interview!  [facility] [address], [place], [place] [phone]   [facility] Health Department  [phone]   [facility] Health Department  [phone]   [facility]  [phone]    Behavioral Health Resources in the Community: Intensive Outpatient Programs Organization         Address  Phone  Notes  [facility] [address] [place], [place] [phone]   [facility] Outpatient [address] [place], [place] [phone]   [facility]: [facility] [address] [place], [place]  [phone]   [facility] Mental Health [address],  [place], [place] [phone] or [phone]   Substance Abuse Resources Organization         Address  Phone  Notes  Al[facility] Drug Services  [phone]   [facility]  [phone]   [facility] [place]  [phone]   Floydene Flock  [phone]   Residential & Outpatient Substance Abuse Program  [phone]   Psychological Services Organization         Address  Phone  Notes  [facility] Behavioral Health  [phone]   [facility]  [phone]   [facility] Mental Health [address] [place] [phone] or [phone]    Mobile Crisis Teams Organization         Address  Phone  Notes  [facility], Mobile Crisis Care Unit  [phone]   [facility]  [address] [place], [place] [phone]   Doristine Locks [address] [place] [phone]    Self-Help/Support Groups Organization         Address  Phone             Notes  [facility] [place] - variety of support groups  [phone]- I7437963 Call for more information  Narcotics Anonymous (NA), [facility] [address], [employer] [place]  2 meetings at this location   Statistician         Address  Phone  Notes  [facility] [address],    [place]  [phone]  [facility]  [address], [place] [phone], [place], [place] [phone]   [facility] [address], [place] [phone]  Admissions: 8am-3pm M-F  [facility] [address]    [place], [place] [phone]   [facility] [address], [place], [place] [phone]   [facility] [address]  [place], [place] [phone]   [facility] - Intensive Outpatient [address], [place], [place] [phone]   [facility] ([facility] [address]  [place], [place] [phone] or [phone]   [facility] (RT[facility]) [address] [place], [place] [phone] Accepts Medicaid  [facility] [address]  [place] [phone] Substance Abuse/Addiction Treatment   [facility] Behavioral Health Resources Organization         Address  Phone  Notes  [facility]  [phone]) [phone]   Angie FavaJulie Brannon, PhD [address], [address] [place], [place]   [phone]) [phone] or [phone]) [phone]   [facility]   [address] [place], [place] [phone]) [phone]   [facility] [address], [place], [place] [phone]) [phone] Insurance/Medicaid/sponsorship through [facility]  Faith [facility] Families [address]                                    [place], [place] [phone]) [phone] Therapy/tele-psych/case  [facility] [address].   [place], [place] [phone]) [phone]    Dr. Lolly MustacheArfeen  [phone]) [phone]   [facility] [place]  [employer] [facility] 1) [address], [place] 2) [address], [place] 3)  [address], [place] [phone]([phone](336) [phone]  [phone]) [phone]   [facility] Child Abuse Hotline [phone]) [phone] or [phone]) [phone] (After Hours)

## 2013-10-10 NOTE — ED Provider Notes (Signed)
CSN: [id number]     Arrival date & time 10/10/13  2235 History   None    This chart was scribed for non-physician practitioner, Coral CeoJessica Antonietta Lansdowne, PA-C working with Dagmar HaitWilliam Blair Walden, MD by Arlan OrganAshley Leger, ED Scribe. This patient was seen in room TR08C/TR08C and the patient's care was started at 11:41 PM.   Chief Complaint  Patient presents with  . Rib Injury   HPI  HPI Comments: Jack King is a 58 y.o. male with a PMHx of COPD and HTN who presents to the Emergency Department complaining of a rib injury that occurred around 6 PM this evening. Pt states his knee went out resulting in a fall landing against his bed. He now c/o constant, moderate pain to his ribs. No other injuries or pain. No head injury, LOC or syncope. States this pain is exacerbated with movement and deep breathing. No alleviating factors at this time. He has not tried anything OTC for his symptoms. However, pt states he had a few beers prior to arrival. At this time he denies any HA, fever, chills, SOB, hemoptysis, chest pain, back pain, knee pain, neck pain, or weakness. Pt is an every day smoker and smokes about 2 packs of cigarettes a day. He plans to follow up with his PCP on 5/19. He has no pertinent medical history. No other concerns this visit.    Past Medical History  Diagnosis Date  . Hypertension    History reviewed. No pertinent past surgical history. No family history on file. History  Substance Use Topics  . Smoking status: Current Every Day Smoker -- 2.00 packs/day  . Smokeless tobacco: Not on file  . Alcohol Use: Yes     Comment: daily    Review of Systems  Constitutional: Negative for fever and chills.  HENT: Negative for congestion.   Eyes: Negative for redness.  Respiratory: Negative for cough, shortness of breath and wheezing.   Cardiovascular: Negative for chest pain and leg swelling.  Gastrointestinal: Negative for nausea, vomiting and abdominal pain.  Musculoskeletal: Negative for  arthralgias, back pain, gait problem, joint swelling, myalgias and neck pain.       Rip pain  Skin: Negative for wound.  Neurological: Negative for dizziness, syncope, weakness, light-headedness, numbness and headaches.  Psychiatric/Behavioral: Negative for confusion.    Allergies  Review of patient's allergies indicates no known allergies.  Home Medications   Prior to Admission medications   Medication Sig Start Date End Date Taking? Authorizing Provider  aspirin 325 MG tablet Take 650 mg by mouth daily.    Historical Provider, MD  hydrochlorothiazide (MICROZIDE) 12.5 MG capsule Take 12.5 mg by mouth daily.    Historical Provider, MD   Triage Vitals: BP 134/84  Pulse 78  Temp(Src) 98.1 F (36.7 C) (Oral)  Resp 16  Ht 5\' 9"  (1.753 m)  Wt 200 lb (90.719 kg)  BMI 29.52 kg/m2  SpO2 95%   Filed Vitals:   10/10/13 2238 10/11/13 0013  BP: 134/84 137/78  Pulse: 78 68  Temp: 98.1 F (36.7 C)   TempSrc: Oral   Resp: 16 15  Height: 5\' 9"  (1.753 m)   Weight: 200 lb (90.719 kg)   SpO2: 95% 97%    Physical Exam  Nursing note and vitals reviewed. Constitutional: He is oriented to person, place, and time. He appears well-developed and well-nourished. No distress.  Patient appears intoxicated  HENT:  Head: Normocephalic and atraumatic.  Right Ear: External ear normal.  Left Ear: External ear  normal.  Nose: Nose normal.  Mouth/Throat: Oropharynx is clear and moist. No oropharyngeal exudate.  No tenderness to the scalp or face throughout. No palpable hematoma, step-offs, or lacerations throughout.  Tympanic membranes gray and translucent bilaterally.    Eyes: Conjunctivae and EOM are normal. Pupils are equal, round, and reactive to light. Right eye exhibits no discharge. Left eye exhibits no discharge.  Neck: Normal range of motion. Neck supple.  No cervical spinal or paraspinal tenderness to palpation throughout.  No limitations with neck ROM.    Cardiovascular: Normal rate,  regular rhythm, normal heart sounds and intact distal pulses.  Exam reveals no gallop and no friction rub.   No murmur heard. Pulmonary/Chest: Effort normal and breath sounds normal. No respiratory distress. He has no wheezes. He has no rales. He exhibits tenderness.    Tenderness to palpation to the lateral right inferior ribs diffusely with no focal tenderness. No erythema, step-off, crepitus, ecchymosis, or edema.   Abdominal: Soft. Bowel sounds are normal.  Musculoskeletal: Normal range of motion. He exhibits no edema and no tenderness.  No tenderness to palpation to the thoracic or lumbar spinous processes throughout. No tenderness to palpation to the paraspinal muscles throughout. No tenderness to palpation to the knees bilaterally or extremities. Strength 5/5 in the upper and lower extremities bilaterally. Patient able to ambulate without difficulty or ataxia.   Neurological: He is alert and oriented to person, place, and time.  GCS 15.  No focal neurological deficits.  CN 2-12 intact.    Skin: Skin is warm and dry. He is not diaphoretic.     ED Course  Procedures (including critical care time)  DIAGNOSTIC STUDIES: Oxygen Saturation is 95% on RA, adequate by my interpretation.    COORDINATION OF CARE: 11:20 PM- Will order DG ribs unilateral w/ contrast R. Discussed treatment plan with pt at bedside and pt agreed to plan.     Labs Review Labs Reviewed - No data to display  Imaging Review No results found.   EKG Interpretation None      MDM   Jack King is a 58 y.o. male male with a PMHx of COPD and HTN who presents to the Emergency Department complaining of a rib injury that occurred around 6 PM this evening. Etiology of rib pain likely due to a contusion. X-rays negative for fracture or malalignment. Vital signs stable. No hypoxia, respiratory distress, or tachypnea. Doubt cardiopulmonary process. Patient given Tylenol in the ED. Has upcoming appointment with PCP.  Clinically intoxicated in the ED. Patient able to ambulate without difficulty or ataxia. No focal neurological deficits. Patient denies any LOC, head injury, syncope, or headache. Return precautions, discharge instructions, and follow-up was discussed with the patient before discharge.     Discharge Medication List as of 10/10/2013 11:53 PM       Final impressions: 1. Rib pain on right side       Luiz IronJessica Katlin Khamil Lamica PA-C   I personally performed the services described in this documentation, which was scribed in my presence. The recorded information has been reviewed and is accurate.    Jillyn LedgerJessica K Naod Sweetland, PA-C 10/11/13 [phone]

## 2013-10-14 NOTE — ED Provider Notes (Signed)
Medical screening examination/treatment/procedure(s) were performed by non-physician practitioner and as supervising physician I was immediately available for consultation/collaboration.   EKG Interpretation None        William Oprah Camarena, MD 10/14/13 1619 

## 2017-02-01 DIAGNOSIS — M6282 Rhabdomyolysis: Secondary | ICD-10-CM | POA: Diagnosis not present

## 2017-02-01 DIAGNOSIS — D72829 Elevated white blood cell count, unspecified: Secondary | ICD-10-CM | POA: Insufficient documentation

## 2017-02-01 DIAGNOSIS — B1920 Unspecified viral hepatitis C without hepatic coma: Secondary | ICD-10-CM | POA: Diagnosis not present

## 2017-02-01 DIAGNOSIS — Z79899 Other long term (current) drug therapy: Secondary | ICD-10-CM | POA: Diagnosis not present

## 2017-02-01 DIAGNOSIS — E871 Hypo-osmolality and hyponatremia: Secondary | ICD-10-CM | POA: Diagnosis not present

## 2017-02-01 DIAGNOSIS — E860 Dehydration: Secondary | ICD-10-CM | POA: Diagnosis not present

## 2017-02-01 DIAGNOSIS — R4182 Altered mental status, unspecified: Secondary | ICD-10-CM | POA: Diagnosis not present

## 2017-02-01 DIAGNOSIS — I10 Essential (primary) hypertension: Secondary | ICD-10-CM | POA: Diagnosis not present

## 2017-02-01 DIAGNOSIS — F17210 Nicotine dependence, cigarettes, uncomplicated: Secondary | ICD-10-CM | POA: Insufficient documentation

## 2017-02-01 DIAGNOSIS — N179 Acute kidney failure, unspecified: Principal | ICD-10-CM | POA: Insufficient documentation

## 2017-02-02 ENCOUNTER — Encounter (HOSPITAL_COMMUNITY): Payer: Self-pay | Admitting: Nurse Practitioner

## 2017-02-02 ENCOUNTER — Observation Stay (HOSPITAL_COMMUNITY)
Admission: EM | Admit: 2017-02-02 | Discharge: 2017-02-03 | Disposition: A | Discharge status: Home / Self Care | Payer: Medicaid Other | Attending: Internal Medicine | Admitting: Internal Medicine

## 2017-02-02 ENCOUNTER — Emergency Department (HOSPITAL_COMMUNITY): Payer: Medicaid Other

## 2017-02-02 DIAGNOSIS — N179 Acute kidney failure, unspecified: Secondary | ICD-10-CM | POA: Diagnosis not present

## 2017-02-02 DIAGNOSIS — R4182 Altered mental status, unspecified: Secondary | ICD-10-CM | POA: Diagnosis present

## 2017-02-02 DIAGNOSIS — D72829 Elevated white blood cell count, unspecified: Secondary | ICD-10-CM | POA: Diagnosis not present

## 2017-02-02 DIAGNOSIS — E871 Hypo-osmolality and hyponatremia: Secondary | ICD-10-CM

## 2017-02-02 DIAGNOSIS — I10 Essential (primary) hypertension: Secondary | ICD-10-CM

## 2017-02-02 DIAGNOSIS — G9341 Metabolic encephalopathy: Secondary | ICD-10-CM

## 2017-02-02 HISTORY — DX: Unspecified viral hepatitis C without hepatic coma: B19.20

## 2017-02-02 LAB — BASIC METABOLIC PANEL
ANION GAP: 17 — AB (ref 5–15)
Anion gap: 21 — ABNORMAL HIGH (ref 5–15)
BUN: 76 mg/dL — AB (ref 6–20)
BUN: 77 mg/dL — AB (ref 6–20)
CHLORIDE: 92 mmol/L — AB (ref 101–111)
CO2: 21 mmol/L — ABNORMAL LOW (ref 22–32)
CO2: 21 mmol/L — AB (ref 22–32)
CREATININE: 8.65 mg/dL — AB (ref 0.61–1.24)
Calcium: 8.8 mg/dL — ABNORMAL LOW (ref 8.9–10.3)
Calcium: 8.1 mg/dL — ABNORMAL LOW (ref 8.9–10.3)
Chloride: 89 mmol/L — ABNORMAL LOW (ref 101–111)
Creatinine, Ser: 8.39 mg/dL — ABNORMAL HIGH (ref 0.61–1.24)
GFR calc Af Amer: 7 mL/min — ABNORMAL LOW (ref >60)
GFR, EST AFRICAN AMERICAN: 7 mL/min — AB (ref >60)
GFR, EST NON AFRICAN AMERICAN: 6 mL/min — AB (ref >60)
GFR, EST NON AFRICAN AMERICAN: 6 mL/min — AB (ref >60)
GLUCOSE: 99 mg/dL (ref 65–99)
Glucose, Bld: 85 mg/dL (ref 65–99)
POTASSIUM: 4.2 mmol/L (ref 3.5–5.1)
POTASSIUM: 4.3 mmol/L (ref 3.5–5.1)
SODIUM: 131 mmol/L — AB (ref 135–145)
Sodium: 130 mmol/L — ABNORMAL LOW (ref 135–145)

## 2017-02-02 LAB — URINALYSIS, ROUTINE W REFLEX MICROSCOPIC
Bilirubin Urine: NEGATIVE
GLUCOSE, UA: NEGATIVE mg/dL
Ketones, ur: NEGATIVE mg/dL
NITRITE: NEGATIVE
PH: 5 (ref 5.0–8.0)
Protein, ur: 100 mg/dL — AB
SPECIFIC GRAVITY, URINE: 1.018 (ref 1.005–1.030)

## 2017-02-02 LAB — CBC
HCT: 41.4 % (ref 39.0–52.0)
Hemoglobin: 14.7 g/dL (ref 13.0–17.0)
MCH: 33.9 pg (ref 26.0–34.0)
MCHC: 35.5 g/dL (ref 30.0–36.0)
MCV: 95.6 fL (ref 78.0–100.0)
Platelets: 294 10*3/uL (ref 150–400)
RBC: 4.33 MIL/uL (ref 4.22–5.81)
RDW: 13.5 % (ref 11.5–15.5)
WBC: 14.1 10*3/uL — ABNORMAL HIGH (ref 4.0–10.5)

## 2017-02-02 LAB — HEPATIC FUNCTION PANEL
ALT: 60 U/L (ref 17–63)
AST: 100 U/L — AB (ref 15–41)
Albumin: 4.1 g/dL (ref 3.5–5.0)
Alkaline Phosphatase: 65 U/L (ref 38–126)
BILIRUBIN DIRECT: 0.2 mg/dL (ref 0.1–0.5)
BILIRUBIN TOTAL: 0.8 mg/dL (ref 0.3–1.2)
Indirect Bilirubin: 0.6 mg/dL (ref 0.3–0.9)
Total Protein: 8.1 g/dL (ref 6.5–8.1)

## 2017-02-02 LAB — RAPID URINE DRUG SCREEN, HOSP PERFORMED
Amphetamines: NOT DETECTED
Barbiturates: NOT DETECTED
Benzodiazepines: NOT DETECTED
Cocaine: NOT DETECTED
OPIATES: POSITIVE — AB
Tetrahydrocannabinol: POSITIVE — AB

## 2017-02-02 LAB — MAGNESIUM: Magnesium: 3.1 mg/dL — ABNORMAL HIGH (ref 1.7–2.4)

## 2017-02-02 LAB — CREATININE, URINE, RANDOM: CREATININE, URINE: 265.26 mg/dL

## 2017-02-02 LAB — PHOSPHORUS: Phosphorus: 7.7 mg/dL — ABNORMAL HIGH (ref 2.5–4.6)

## 2017-02-02 LAB — SODIUM, URINE, RANDOM: Sodium, Ur: <10 mmol/L

## 2017-02-02 LAB — ETHANOL: Alcohol, Ethyl (B): <5 mg/dL (ref <5)

## 2017-02-02 LAB — SEDIMENTATION RATE: SED RATE: 40 mm/h — AB (ref 0–16)

## 2017-02-02 LAB — CBG MONITORING, ED: Glucose-Capillary: 83 mg/dL (ref 65–99)

## 2017-02-02 LAB — CREATININE, SERUM
CREATININE: 6.11 mg/dL — AB (ref 0.61–1.24)
GFR, EST AFRICAN AMERICAN: 10 mL/min — AB (ref >60)
GFR, EST NON AFRICAN AMERICAN: 9 mL/min — AB (ref >60)

## 2017-02-02 LAB — TSH: TSH: 0.367 u[IU]/mL (ref 0.350–4.500)

## 2017-02-02 MED ORDER — SODIUM CHLORIDE 0.9 % IV SOLN
INTRAVENOUS | Status: DC
  Administered 2017-02-02 – 2017-02-03 (×2): via INTRAVENOUS

## 2017-02-02 MED ORDER — SODIUM CHLORIDE 0.9 % IV BOLUS (SEPSIS)
1000.0000 mL | Freq: Once | INTRAVENOUS | Status: AC
  Administered 2017-02-02: 1000 mL via INTRAVENOUS

## 2017-02-02 MED ORDER — ACETAMINOPHEN 650 MG RE SUPP: 650.0000 mg | Freq: Four times a day (QID) | RECTAL | Status: DC | PRN

## 2017-02-02 MED ORDER — HEPARIN SODIUM (PORCINE) 5000 UNIT/ML IJ SOLN
5000.0000 [IU] | Freq: Three times a day (TID) | INTRAMUSCULAR | Status: DC
  Administered 2017-02-02 – 2017-02-03 (×3): 5000 [IU] via SUBCUTANEOUS
  Filled 2017-02-02 (×2): qty 1

## 2017-02-02 MED ORDER — ACETAMINOPHEN 325 MG PO TABS
650.0000 mg | ORAL_TABLET | Freq: Four times a day (QID) | ORAL | Status: DC | PRN
  Administered 2017-02-03: 650 mg via ORAL
  Filled 2017-02-02: qty 2

## 2017-02-02 NOTE — ED Provider Notes (Signed)
[facility]-EMERGENCY DEPT Provider Note   CSN: [id number] Arrival date & time: 02/01/17  2344     History   Chief Complaint Chief Complaint  Patient presents with  . Dizziness  . Fall    HPI   Blood pressure 97/64, pulse 75, temperature 98.8 F (37.1 C), temperature source Oral, resp. rate 17, SpO2 98 %.  Jack King is a 61 y.o. male with past medical history significant for hypertension accompanied by son complaining of 8x  falls over the course of the last 3 days with associated confusion. Patient works as a Designer, fashion/clothing and initially patient's son thought he was just tired and dehydrated however symptoms are worsening and progressive. Patient denies headache, change in vision, nausea, vomiting, reduced by mouth intake, reduced urinary output, dark or tarry stools. He denies any pain. He states that he does not feel lightheaded when he stands up. This patient's son has noted that he has fallen so many times and hit his head multiple times and he is amnestic to the falls. He is not anticoagulated. He denies neck pain, numbness, weakness. Patient states she's been compliant with his HCTZ high blood pressure medication. Patient states that he drinks alcohol regularly, he's never had an issue with withdrawals. He smokes marijuana occasionally, no other drug use.  Patient has never been told that he has renal issues in the past, he doesn't take a lot of NSAIDs.   Past Medical History:  Diagnosis Date  . Hepatitis C   . Hypertension     Patient Active Problem List   Diagnosis Date Noted  . ARF (acute renal failure) (HCC) 02/02/2017  . Hyponatremia 02/02/2017  . Leukocytosis 02/02/2017  . Hypertension 02/02/2017    History reviewed. No pertinent surgical history.     Home Medications    Prior to Admission medications   Medication Sig Start Date End Date Taking? Authorizing Provider  aspirin 325 MG tablet Take 650 mg by mouth daily.    [provider]    hydrochlorothiazide (MICROZIDE) 12.5 MG capsule Take 12.5 mg by mouth daily.    [provider]    Family History Family History  Problem Relation Age of Onset  . CAD Mother   . CAD Father     Social History Social History  Substance Use Topics  . Smoking status: Current Every Day Smoker    Packs/day: 2.00    Years: 40.00    Types: Cigarettes  . Smokeless tobacco: Never Used  . Alcohol use Yes     Comment: daily     Allergies   Patient has no known allergies.   Review of Systems Review of Systems  A complete review of systems was obtained and all systems are negative except as noted in the HPI and PMH.    Physical Exam Updated Vital Signs BP (!) 90/53 (BP Location: Right Arm)   Pulse 76   Temp 98.8 F (37.1 C) (Oral)   Resp 10   SpO2 96%   Physical Exam  Constitutional: He is oriented to person, place, and time. He appears well-developed and well-nourished.  HENT:  Head: Normocephalic and atraumatic.  Mouth/Throat: Oropharynx is clear and moist.  Eyes: Pupils are equal, round, and reactive to light. Conjunctivae and EOM are normal.  No TTP of maxillary or frontal sinuses  No TTP or induration of temporal arteries bilaterally  Neck: Normal range of motion. Neck supple.  FROM to C-spine. Pt can touch chin to chest without discomfort. No TTP of  midline cervical spine.   Cardiovascular: Normal rate, regular rhythm and intact distal pulses.   Pulmonary/Chest: Effort normal and breath sounds normal. No respiratory distress. He has no wheezes. He has no rales. He exhibits no tenderness.  Abdominal: Soft. Bowel sounds are normal. There is no tenderness.  Musculoskeletal: Normal range of motion. He exhibits no edema or tenderness.  Neurological: He is alert and oriented to person, place, and time. No cranial nerve deficit.  Oriented 3 but mildly confused and confabulating.  II-Visual fields grossly intact. III/IV/VI-Extraocular movements intact.   Pupils reactive bilaterally. V/VII-Smile symmetric, equal eyebrow raise,  facial sensation intact VIII- Hearing grossly intact IX/X-Normal gag XI-bilateral shoulder shrug XII-midline tongue extension Motor: 5/5 bilaterally with normal tone and bulk Cerebellar: Bilateral dysmetria on finger to nose.   Nursing note and vitals reviewed.    ED Treatments / Results  Labs (all labs ordered are listed, but only abnormal results are displayed) Labs Reviewed  BASIC METABOLIC PANEL - Abnormal; Notable for the following:       Result Value   Sodium 131 (*)    Chloride 89 (*)    CO2 21 (*)    BUN 76 (*)    Creatinine, Ser 8.65 (*)    Calcium 8.8 (*)    GFR calc non Af Amer 6 (*)    GFR calc Af Amer 7 (*)    Anion gap 21 (*)    All other components within normal limits  CBC - Abnormal; Notable for the following:    WBC 14.1 (*)    All other components within normal limits  HEPATIC FUNCTION PANEL - Abnormal; Notable for the following:    AST 100 (*)    All other components within normal limits  ETHANOL  RAPID URINE DRUG SCREEN, HOSP PERFORMED  URINALYSIS, ROUTINE W REFLEX MICROSCOPIC  SODIUM, URINE, RANDOM  CREATININE, URINE, RANDOM  UREA NITROGEN, URINE  CBG MONITORING, ED    EKG  EKG Interpretation  Date/Time:  Sunday February 02 2017 02:26:02 EDT Ventricular Rate:  74 PR Interval:    QRS Duration: 102 QT Interval:  376 QTC Calculation: 418 R Axis:   56 Text Interpretation:  Sinus rhythm Minimal ST elevation, anterior leads since last tracing no significant change Confirmed by Mancel BaleWentz, Elliott [phone]([id number]) on 02/02/2017 5:34:38 AM       Radiology Ct Head Wo Contrast  Result Date: 02/02/2017 CLINICAL DATA:  61 y/o  M; status post fall with head injury. EXAM: CT HEAD WITHOUT CONTRAST CT CERVICAL SPINE WITHOUT CONTRAST TECHNIQUE: Multidetector CT imaging of the head and cervical spine was performed following the standard protocol without intravenous contrast. Multiplanar CT  image reconstructions of the cervical spine were also generated. COMPARISON:  06/21/2003 CT head and cervical spine. FINDINGS: CT HEAD FINDINGS Brain: No evidence of acute infarction, hemorrhage, hydrocephalus, extra-axial collection or mass lesion/mass effect. Small lucency in right cerebellar hemisphere, probably a chronic lacunar infarction. Vascular: Moderate calcific atherosclerosis of the carotid siphons. No hyperdense vessel identified. Skull: Normal. Negative for fracture or focal lesion. Sinuses/Orbits: No acute finding. Other: None. CT CERVICAL SPINE FINDINGS Alignment: Straightening of cervical lordosis.  No listhesis. Skull base and vertebrae: No acute fracture. No primary bone lesion or focal pathologic process. Soft tissues and spinal canal: No prevertebral fluid or swelling. No visible canal hematoma. Disc levels: Advanced cervical degenerative changes with discogenic disease greatest at the C5 through C7 levels. Mild facet arthrosis. Uncovertebral hypertrophy encroaches on the neural foramen bilaterally greatest at the C3-4  and C5-6 levels. Multilevel mild bony stenosis of the spinal canal. Upper chest: Negative. Other: Negative. IMPRESSION: 1. No acute intracranial abnormality identified. 2. No displaced calvarial fracture. 3. No acute fracture or dislocation of the cervical spine. 4. Subcentimeter lucency in right cerebellar hemisphere, likely chronic lacunar infarct. 5. 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 No high-grade bony canal stenosis. Electronically Signed   By: Mitzi Hansen M.Jack.   On: 02/02/2017 04:32   Ct Cervical Spine Wo Contrast  Result Date: 02/02/2017 CLINICAL DATA:  61 y/o  M; status post fall with head injury. EXAM: CT HEAD WITHOUT CONTRAST CT CERVICAL SPINE WITHOUT CONTRAST TECHNIQUE: Multidetector CT imaging of the head and cervical spine was performed following the standard protocol without intravenous contrast.  Multiplanar CT image reconstructions of the cervical spine were also generated. COMPARISON:  06/21/2003 CT head and cervical spine. FINDINGS: CT HEAD FINDINGS Brain: No evidence of acute infarction, hemorrhage, hydrocephalus, extra-axial collection or mass lesion/mass effect. Small lucency in right cerebellar hemisphere, probably a chronic lacunar infarction. Vascular: Moderate calcific atherosclerosis of the carotid siphons. No hyperdense vessel identified. Skull: Normal. Negative for fracture or focal lesion. Sinuses/Orbits: No acute finding. Other: None. CT CERVICAL SPINE FINDINGS Alignment: Straightening of cervical lordosis.  No listhesis. Skull base and vertebrae: No acute fracture. No primary bone lesion or focal pathologic process. Soft tissues and spinal canal: No prevertebral fluid or swelling. No visible canal hematoma. Disc levels: Advanced cervical degenerative changes with discogenic disease greatest at the C5 through C7 levels. Mild facet arthrosis. Uncovertebral hypertrophy encroaches on the neural foramen bilaterally greatest at the C3-4 and C5-6 levels. Multilevel mild bony stenosis of the spinal canal. Upper chest: Negative. Other: Negative. IMPRESSION: 1. No acute intracranial abnormality identified. 2. No displaced calvarial fracture. 3. No acute fracture or dislocation of the cervical spine. 4. Subcentimeter lucency in right cerebellar hemisphere, likely chronic lacunar infarct. 5. 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 No high-grade bony canal stenosis. Electronically Signed   By: Mitzi Hansen M.Jack.   On: 02/02/2017 04:32   US Renal  Result Date: 02/02/2017 CLINICAL DATA:  New renal failure EXAM: RENAL / URINARY TRACT ULTRASOUND COMPLETE COMPARISON:  None. FINDINGS: Right Kidney: Length: 12.1 cm. Echogenicity within normal limits. No mass or hydronephrosis visualized. Left Kidney: Length: 11.5 cm. Echogenicity within normal limits.  No mass or hydronephrosis visualized. Bladder: Appears normal for degree of bladder distention. IMPRESSION: Negative for hydronephrosis.  Normal kidneys. Electronically Signed   By: Ellery Plunk M.Jack.   On: 02/02/2017 05:25    Procedures Procedures (including critical care time)  Medications Ordered in ED Medications  sodium chloride 0.9 % bolus 1,000 mL (1,000 mLs Intravenous New Bag/Given 02/02/17 0524)     Initial Impression / Assessment and Plan / ED Course  I have reviewed the triage vital signs and the nursing notes.  Pertinent labs & imaging results that were available during my care of the patient were reviewed by me and considered in my medical decision making (see chart for details).     Vitals:   02/02/17 0403 02/02/17 0430 02/02/17 0654 02/02/17 0702  BP: 91/68 100/71 (!) 87/63 (!) 90/53  Pulse: 77  72 76  Resp: 15 12 10 10   Temp:      TempSrc:      SpO2: 99%  96% 96%    Medications  sodium chloride 0.9 % bolus 1,000 mL (1,000 mLs Intravenous New Bag/Given 02/02/17 0524)    Daphine Deutscher  Jack King is 61 y.o. male presenting with Dizziness and confusion with multiple falls over the last several days. Neurologic exam is grossly nonfocal, dysmetria on finger to nose. Patient with multiple falls and head trauma, will obtain head CT. Blood work with a significant elevation in creatinine to 8.6, this is atypical for his baseline which is normally under 1. He does not take any NSAIDs. He does work as a Designer, fashion/clothing and there may be some dehydration contributing to the acute kidney injury. Patient with no overt urinary retention, he is old list. No indication for emergent dialysis, no shortness of breath, potassium normal. Renal ultrasound without acute findings, urine chemistries pending.  Head CT negative, patient will be admitted for workup of acute renal failure. Falls and confusion likely secondary to a metabolic encephalopathy from the kidney dysfunction.    Final Clinical  Impressions(s) / ED Diagnoses   Final diagnoses:  Acute renal failure, unspecified acute renal failure type [facility])  Metabolic encephalopathy    New Prescriptions New Prescriptions   No medications on file     Kaylyn Lim 02/02/17 Hosie Poisson, MD 02/02/17 [phone]

## 2017-02-02 NOTE — ED Notes (Signed)
He has been, and remains drowsy and arouseable. He is oriented to his situation, and easily follows commands with all extremities.
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
## 2017-02-02 NOTE — Progress Notes (Signed)
Pt seen and examined at bedside this AM, pt admitted after midnight, please see earlier admission note by Dr. Selena BattenKim. Pt admitted with working diagnosis of ARF. This is suspected to be pre renal in etiology. IVF have been provided and Cr is slightly improving. Will continue IVF, monitor BMP. [facility]   Debbora PrestoMAGICK-Kindal Ponti, MD  [facility] Pager [phone]  If 7PM-7AM, please contact night-coverage [email or web address] Password [email or web address]

## 2017-02-02 NOTE — ED Notes (Signed)
Ultrasound at bedside

## 2017-02-02 NOTE — ED Notes (Signed)
US tech reports bladder volume to be 167ml.

## 2017-02-02 NOTE — ED Notes (Signed)
Hospitalist at bedside 
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
## 2017-02-02 NOTE — H&P (Signed)
TR[facility] H&P   Patient Demographics:    Jack King, is a 61 y.o. male  MRN: [id number]   DOB - November 26, 1955  Admit Date - 02/02/2017  Outpatient Primary MD for the patient is Nolene Ebbs, MD  Referring MD/NP/PA:  [facility]:   Patient coming from: from sons home  Chief Complaint  Patient presents with  . Dizziness  . Fall      HPI:    Jack King  is a 61 y.o. male, w hypertension, presents with altered mental status "blanking out"  "can't recall anything. Pt appears to be a poor historian.  axox3 Denies NSAIDS,  Denies IV dye.   In ED, CT brain => IMPRESSION: 1. No acute intracranial abnormality identified. 2. No displaced calvarial fracture. 3. No acute fracture or dislocation of the cervical spine. 4. Subcentimeter lucency in right cerebellar hemisphere, likely chronic lacunar infarct. 5. 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 No high-grade bony canal stenosis.  Renal ultrasound=> negative for hydronephrosis.   Bun/creat 76/8.65   K=4.2 Na 131  Pt will be admitted for AMS and ARF.     Review of systems:    In addition to the HPI above,  No Fever-chills, No Headache, No changes with Vision or hearing, No problems swallowing food or Liquids, No Chest pain, Cough or Shortness of Breath, No Abdominal pain, No Nausea or Vommitting, Bowel movements are regular, No Blood in stool or Urine, No dysuria, No new skin rashes or bruises, No new joints pains-aches,  No new weakness, tingling, numbness in any extremity, No recent weight gain or loss, No polyuria, polydypsia or polyphagia, No significant Mental Stressors.  A full 10 point Review of Systems was done, except as stated above, all other Review of Systems were negative.   With Past History of the following :    Past  Medical History:  Diagnosis Date  . Hypertension       History reviewed. No pertinent surgical history.    Social History:     Social History  Substance Use Topics  . Smoking status: Current Every Day Smoker    Packs/day: 2.00    Years: 40.00    Types: Cigarettes  . Smokeless tobacco: Never Used  . Alcohol use Yes     Comment: daily     Lives - at home  Mobility - walks by self  Family History :     Family History  Problem Relation Age of Onset  . CAD Mother   . CAD Father       Home Medications:   Prior to Admission medications   Medication Sig Start Date End Date Taking? Authorizing Provider  aspirin 325 MG tablet Take 650 mg by mouth daily.    [provider]  hydrochlorothiazide (MICROZIDE) 12.5 MG capsule Take 12.5 mg by mouth daily.    [provider]     Allergies:    No Known Allergies   Physical Exam:   Vitals  Blood pressure 91/68, pulse 77, temperature 98.8 F (37.1 C), temperature source Oral, resp. rate 15, SpO2 99 %.   1. General  lying in bed in NAD,    2. Normal affect and insight, Not Suicidal or Homicidal, Awake Alert, Oriented X 3.  3. No F.N deficits, ALL C.Nerves Intact, Strength 5/5 all 4 extremities, Sensation intact all 4 extremities, Plantars down going.  4. Ears and Eyes appear Normal, Conjunctivae clear, PERRLA. Moist Oral Mucosa.  5. Supple Neck, No JVD, No cervical lymphadenopathy appriciated, No Carotid Bruits.  6. Symmetrical Chest wall movement, Good air movement bilaterally, CTAB.  7. RRR, No Gallops, Rubs or Murmurs, No Parasternal Heave.  8. Positive Bowel Sounds, Abdomen Soft, No tenderness, No organomegaly appriciated,No rebound -guarding or rigidity.  9.  No Cyanosis, Normal Skin Turgor, No Skin Rash or Bruise.  10. Good muscle tone,  joints appear normal , no effusions, Normal ROM.  11. No Palpable Lymph Nodes in Neck or Axillae     Data Review:    CBC  Recent Labs Lab  02/02/17 0034  WBC 14.1*  HGB 14.7  HCT 41.4  PLT 294  MCV 95.6  MCH 33.9  MCHC 35.5  RDW 13.5   ------------------------------------------------------------------------------------------------------------------  Chemistries   Recent Labs Lab 02/02/17 0034  NA 131*  K 4.2  CL 89*  CO2 21*  GLUCOSE 85  BUN 76*  CREATININE 8.65*  CALCIUM 8.8*   ------------------------------------------------------------------------------------------------------------------ CrCl cannot be calculated (Unknown ideal weight.). ------------------------------------------------------------------------------------------------------------------ No results for input(s): TSH, T4TOTAL, T3FREE, THYROIDAB in the last 72 hours.  Invalid input(s): FREET3  Coagulation profile No results for input(s): INR, PROTIME in the last 168 hours. ------------------------------------------------------------------------------------------------------------------- No results for input(s): DDIMER in the last 72 hours. -------------------------------------------------------------------------------------------------------------------  Cardiac Enzymes No results for input(s): CKMB, TROPONINI, MYOGLOBIN in the last 168 hours.  Invalid input(s): CK ------------------------------------------------------------------------------------------------------------------ No results found for: BNP   ---------------------------------------------------------------------------------------------------------------  Urinalysis No results found for: COLORURINE, APPEARANCEUR, LABSPEC, PHURINE, GLUCOSEU, HGBUR, BILIRUBINUR, KETONESUR, PROTEINUR, UROBILINOGEN, NITRITE, LEUKOCYTESUR  ----------------------------------------------------------------------------------------------------------------   Imaging Results:    Ct Head Wo Contrast  Result Date: 02/02/2017 CLINICAL DATA:  61 y/o  M; status post fall with head injury. EXAM: CT HEAD  WITHOUT CONTRAST CT CERVICAL SPINE WITHOUT CONTRAST TECHNIQUE: Multidetector CT imaging of the head and cervical spine was performed following the standard protocol without intravenous contrast. Multiplanar CT image reconstructions of the cervical spine were also generated. COMPARISON:  06/21/2003 CT head and cervical spine. FINDINGS: CT HEAD FINDINGS Brain: No evidence of acute infarction, hemorrhage, hydrocephalus, extra-axial collection or mass lesion/mass effect. Small lucency in right cerebellar hemisphere, probably a chronic lacunar infarction. Vascular: Moderate calcific atherosclerosis of the carotid siphons. No hyperdense vessel identified. Skull: Normal. Negative for fracture or focal lesion. Sinuses/Orbits: No acute finding. Other: None. CT CERVICAL SPINE FINDINGS Alignment: Straightening of cervical lordosis.  No listhesis. Skull base and vertebrae: No acute fracture. No primary bone lesion or focal pathologic process. Soft tissues and spinal canal: No prevertebral fluid or swelling. No visible canal hematoma. Disc levels: Advanced cervical degenerative changes with discogenic disease greatest at the C5 through C7 levels. Mild facet arthrosis. Uncovertebral hypertrophy encroaches on the neural foramen bilaterally greatest at the C3-4 and C5-6 levels. Multilevel mild bony stenosis of the spinal canal. Upper chest: Negative. Other: Negative. IMPRESSION: 1. No acute intracranial abnormality identified. 2. No displaced calvarial fracture. 3.  No acute fracture or dislocation of the cervical spine. 4. Subcentimeter lucency in right cerebellar hemisphere, likely chronic lacunar infarct. 5. 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 No high-grade bony canal stenosis. Electronically Signed   By: Kristine Garbe M.D.   On: 02/02/2017 04:32   Ct Cervical Spine Wo Contrast  Result Date: 02/02/2017 CLINICAL DATA:  61 y/o  M; status post fall with head injury.  EXAM: CT HEAD WITHOUT CONTRAST CT CERVICAL SPINE WITHOUT CONTRAST TECHNIQUE: Multidetector CT imaging of the head and cervical spine was performed following the standard protocol without intravenous contrast. Multiplanar CT image reconstructions of the cervical spine were also generated. COMPARISON:  06/21/2003 CT head and cervical spine. FINDINGS: CT HEAD FINDINGS Brain: No evidence of acute infarction, hemorrhage, hydrocephalus, extra-axial collection or mass lesion/mass effect. Small lucency in right cerebellar hemisphere, probably a chronic lacunar infarction. Vascular: Moderate calcific atherosclerosis of the carotid siphons. No hyperdense vessel identified. Skull: Normal. Negative for fracture or focal lesion. Sinuses/Orbits: No acute finding. Other: None. CT CERVICAL SPINE FINDINGS Alignment: Straightening of cervical lordosis.  No listhesis. Skull base and vertebrae: No acute fracture. No primary bone lesion or focal pathologic process. Soft tissues and spinal canal: No prevertebral fluid or swelling. No visible canal hematoma. Disc levels: Advanced cervical degenerative changes with discogenic disease greatest at the C5 through C7 levels. Mild facet arthrosis. Uncovertebral hypertrophy encroaches on the neural foramen bilaterally greatest at the C3-4 and C5-6 levels. Multilevel mild bony stenosis of the spinal canal. Upper chest: Negative. Other: Negative. IMPRESSION: 1. No acute intracranial abnormality identified. 2. No displaced calvarial fracture. 3. No acute fracture or dislocation of the cervical spine. 4. Subcentimeter lucency in right cerebellar hemisphere, likely chronic lacunar infarct. 5. 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 No high-grade bony canal stenosis. Electronically Signed   By: Kristine Garbe M.D.   On: 02/02/2017 04:32   US Renal  Result Date: 02/02/2017 CLINICAL DATA:  New renal failure EXAM: RENAL / URINARY TRACT  ULTRASOUND COMPLETE COMPARISON:  None. FINDINGS: Right Kidney: Length: 12.1 cm. Echogenicity within normal limits. No mass or hydronephrosis visualized. Left Kidney: Length: 11.5 cm. Echogenicity within normal limits. No mass or hydronephrosis visualized. Bladder: Appears normal for degree of bladder distention. IMPRESSION: Negative for hydronephrosis.  Normal kidneys. Electronically Signed   By: Andreas Newport M.D.   On: 02/02/2017 05:25      Assessment & Plan:    Principal Problem:   ARF (acute renal failure) (HCC) Active Problems:   Hyponatremia   Leukocytosis   Hypertension    ARF STOP hydrochlorothiazide Check urine sodium,urine creatinine, urine eosinophils Check spep, upep, cpk, mag, phos, pth, vitamin D Hydrate with ns iv Please consult nephrology in am Check cmp in am  Hyponatremia Hydrate with ns iv  Leukocytosis Repeat cbc in am  AMS Check b12 , folate, esr, tsh, rpr  ? Lacunar CVA (old) Cont aspirin Check lipid  DVT Prophylaxis Heparin -  Lovenox - SCDs   AM Labs Ordered, also please review Full Orders  Family Communication: Admission, patients condition and plan of care including tests being ordered have been discussed with the patient  who indicate understanding and agree with the plan and Code Status.  Code Status FULL CODE  Likely DC to  home  Condition GUARDED    Consults called:  nephrology  Admission status: inpatient  Time spent in minutes : 45 minutes   Jani Gravel M.D on 02/02/2017 at 5:46 AM  Between 7am to 7pm - Pager - [phone]. After 7pm go to [email or web address] - password [facility]  [facility] - Office  [phone]

## 2017-02-02 NOTE — ED Notes (Signed)
Patient attempting to give urine sample.

## 2017-02-02 NOTE — ED Notes (Signed)
I have just given report to [place], RN on [address]--will transport shortly.

## 2017-02-03 DIAGNOSIS — N179 Acute kidney failure, unspecified: Secondary | ICD-10-CM | POA: Diagnosis not present

## 2017-02-03 LAB — COMPREHENSIVE METABOLIC PANEL
ALK PHOS: 59 U/L (ref 38–126)
ALT: 46 U/L (ref 17–63)
ANION GAP: 8 (ref 5–15)
AST: 54 U/L — ABNORMAL HIGH (ref 15–41)
Albumin: 3.6 g/dL (ref 3.5–5.0)
BILIRUBIN TOTAL: 1 mg/dL (ref 0.3–1.2)
BUN: 49 mg/dL — ABNORMAL HIGH (ref 6–20)
CALCIUM: 8.6 mg/dL — AB (ref 8.9–10.3)
CO2: 27 mmol/L (ref 22–32)
Chloride: 102 mmol/L (ref 101–111)
Creatinine, Ser: 1.46 mg/dL — ABNORMAL HIGH (ref 0.61–1.24)
GFR calc Af Amer: 59 mL/min — ABNORMAL LOW (ref >60)
GFR, EST NON AFRICAN AMERICAN: 51 mL/min — AB (ref >60)
Glucose, Bld: 91 mg/dL (ref 65–99)
POTASSIUM: 4.5 mmol/L (ref 3.5–5.1)
Sodium: 137 mmol/L (ref 135–145)
TOTAL PROTEIN: 7.2 g/dL (ref 6.5–8.1)

## 2017-02-03 LAB — CBC
HCT: 38.1 % — ABNORMAL LOW (ref 39.0–52.0)
HEMOGLOBIN: 13.2 g/dL (ref 13.0–17.0)
MCH: 32.8 pg (ref 26.0–34.0)
MCHC: 34.6 g/dL (ref 30.0–36.0)
MCV: 94.8 fL (ref 78.0–100.0)
Platelets: 254 10*3/uL (ref 150–400)
RBC: 4.02 MIL/uL — ABNORMAL LOW (ref 4.22–5.81)
RDW: 13.3 % (ref 11.5–15.5)
WBC: 8 10*3/uL (ref 4.0–10.5)

## 2017-02-03 LAB — LIPID PANEL
CHOL/HDL RATIO: 5.3 ratio
Cholesterol: 168 mg/dL (ref 0–200)
HDL: 32 mg/dL — AB (ref >40)
LDL Cholesterol: 105 mg/dL — ABNORMAL HIGH (ref 0–99)
Triglycerides: 156 mg/dL — ABNORMAL HIGH (ref <150)
VLDL: 31 mg/dL (ref 0–40)

## 2017-02-03 LAB — VITAMIN B12: Vitamin B-12: 141 pg/mL — ABNORMAL LOW (ref 180–914)

## 2017-02-03 LAB — CK TOTAL AND CKMB (NOT AT ARMC)
CK TOTAL: 1224 U/L — AB (ref 49–397)
CK, MB: 18.2 ng/mL — AB (ref 0.5–5.0)
Relative Index: 1.5 (ref 0.0–2.5)

## 2017-02-03 LAB — UREA NITROGEN, URINE: Urea Nitrogen, Ur: 238 mg/dL

## 2017-02-03 LAB — RPR: RPR Ser Ql: NONREACTIVE

## 2017-02-03 NOTE — Discharge Summary (Signed)
Physician Discharge Summary  [name] D Towery WJX:[id number] DOB: April 04, 1956 DOA: 02/02/2017  PCP: Fleet King, Edwin, MD  Admit date: 02/02/2017 Discharge date: 02/03/2017  Recommendations for Outpatient Follow-up:  1. Pt will need to follow up with PCP in 1-2 weeks post discharge 2. Please obtain BMP to evaluate electrolytes and kidney function 3. Please check CK level as well  4. ACEI/HCTZ stopped until renal function stabilizes 5. Pt wants to hold off on BP medications until he sees his PCP to discuss further   Discharge Diagnoses:  Principal Problem:   ARF (acute renal failure) (HCC) Active Problems:   Hyponatremia   Leukocytosis   Hypertension   Altered mental state  Discharge Condition: Stable  Diet recommendation: Heart healthy diet discussed in details   History of present illness:  61 y.o. male, w hypertension, presented with confusion after working outside. Reported minimal oral intake solids and liquids.   Hospital Course:  Principal Problem:   ARF (acute renal failure) (HCC), rhabdomyolysis  - pre renal in etiology - IVF provided and Cr significantly improved and almost now WNL - pt wants to go home and does not want more blood tests  - he is eating well - advised to stop taking BP medication for now until seen by PCP  Active Problems:   Hyponatremia - pre renal as noted above - improved with IVF    Leukocytosis - reactive  - resolved     Hypertension - stop ACEI, ARB's - no need for antihypertensive at this time until pt sees PCP - BP is stable for now     Altered mental state - from AKI, dehydration - resolved    Procedures/Studies: Ct Head Wo Contrast  Result Date: 02/02/2017 CLINICAL DATA:  61 y/o  M; status post fall with head injury. EXAM: CT HEAD WITHOUT CONTRAST CT CERVICAL SPINE WITHOUT CONTRAST TECHNIQUE: Multidetector CT imaging of the head and cervical spine was performed following the standard protocol without intravenous contrast.  Multiplanar CT image reconstructions of the cervical spine were also generated. COMPARISON:  06/21/2003 CT head and cervical spine. FINDINGS: CT HEAD FINDINGS Brain: No evidence of acute infarction, hemorrhage, hydrocephalus, extra-axial collection or mass lesion/mass effect. Small lucency in right cerebellar hemisphere, probably a chronic lacunar infarction. Vascular: Moderate calcific atherosclerosis of the carotid siphons. No hyperdense vessel identified. Skull: Normal. Negative for fracture or focal lesion. Sinuses/Orbits: No acute finding. Other: None. CT CERVICAL SPINE FINDINGS Alignment: Straightening of cervical lordosis.  No listhesis. Skull base and vertebrae: No acute fracture. No primary bone lesion or focal pathologic process. Soft tissues and spinal canal: No prevertebral fluid or swelling. No visible canal hematoma. Disc levels: Advanced cervical degenerative changes with discogenic disease greatest at the C5 through C7 levels. Mild facet arthrosis. Uncovertebral hypertrophy encroaches on the neural foramen bilaterally greatest at the C3-4 and C5-6 levels. Multilevel mild bony stenosis of the spinal canal. Upper chest: Negative. Other: Negative. IMPRESSION: 1. No acute intracranial abnormality identified. 2. No displaced calvarial fracture. 3. No acute fracture or dislocation of the cervical spine. 4. Subcentimeter lucency in right cerebellar hemisphere, likely chronic lacunar infarct. 5. 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 No high-grade bony canal stenosis. Electronically Signed   By: Mitzi HansenLance  Furusawa-Stratton M.D.   On: 02/02/2017 04:32   Ct Cervical Spine Wo Contrast  Result Date: 02/02/2017 CLINICAL DATA:  61 y/o  M; status post fall with head injury. EXAM: CT HEAD WITHOUT CONTRAST CT CERVICAL SPINE WITHOUT CONTRAST TECHNIQUE: Multidetector CT  imaging of the head and cervical spine was performed following the standard protocol without  intravenous contrast. Multiplanar CT image reconstructions of the cervical spine were also generated. COMPARISON:  06/21/2003 CT head and cervical spine. FINDINGS: CT HEAD FINDINGS Brain: No evidence of acute infarction, hemorrhage, hydrocephalus, extra-axial collection or mass lesion/mass effect. Small lucency in right cerebellar hemisphere, probably a chronic lacunar infarction. Vascular: Moderate calcific atherosclerosis of the carotid siphons. No hyperdense vessel identified. Skull: Normal. Negative for fracture or focal lesion. Sinuses/Orbits: No acute finding. Other: None. CT CERVICAL SPINE FINDINGS Alignment: Straightening of cervical lordosis.  No listhesis. Skull base and vertebrae: No acute fracture. No primary bone lesion or focal pathologic process. Soft tissues and spinal canal: No prevertebral fluid or swelling. No visible canal hematoma. Disc levels: Advanced cervical degenerative changes with discogenic disease greatest at the C5 through C7 levels. Mild facet arthrosis. Uncovertebral hypertrophy encroaches on the neural foramen bilaterally greatest at the C3-4 and C5-6 levels. Multilevel mild bony stenosis of the spinal canal. Upper chest: Negative. Other: Negative. IMPRESSION: 1. No acute intracranial abnormality identified. 2. No displaced calvarial fracture. 3. No acute fracture or dislocation of the cervical spine. 4. Subcentimeter lucency in right cerebellar hemisphere, likely chronic lacunar infarct. 5. 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 No high-grade bony canal stenosis. Electronically Signed   By: Mitzi Hansen M.D.   On: 02/02/2017 04:32   US Renal  Result Date: 02/02/2017 CLINICAL DATA:  New renal failure EXAM: RENAL / URINARY TRACT ULTRASOUND COMPLETE COMPARISON:  None. FINDINGS: Right Kidney: Length: 12.1 cm. Echogenicity within normal limits. No mass or hydronephrosis visualized. Left Kidney: Length: 11.5 cm. Echogenicity  within normal limits. No mass or hydronephrosis visualized. Bladder: Appears normal for degree of bladder distention. IMPRESSION: Negative for hydronephrosis.  Normal kidneys. Electronically Signed   By: Ellery Plunk M.D.   On: 02/02/2017 05:25     Discharge Exam: Vitals:   02/02/17 2155 02/03/17 0559  BP: 128/75 123/89  Pulse: 73 76  Resp: 18 18  Temp: 98.4 F (36.9 C) 98.7 F (37.1 C)  SpO2: 97% 98%   Vitals:   02/02/17 1350 02/02/17 1515 02/02/17 2155 02/03/17 0559  BP: (!) 92/53 102/72 128/75 123/89  Pulse: 70 74 73 76  Resp: 13 17 18 18   Temp: 97.7 F (36.5 C) 98.7 F (37.1 C) 98.4 F (36.9 C) 98.7 F (37.1 C)  TempSrc: Oral Oral Oral Oral  SpO2: 92% 98% 97% 98%  Weight:    90.8 kg (200 lb 1.6 oz)    General: Pt is alert, follows commands appropriately, not in acute distress Cardiovascular: Regular rate and rhythm, S1/S2 +, no murmurs, no rubs, no gallops Respiratory: Clear to auscultation bilaterally, no wheezing, no crackles, no rhonchi Abdominal: Soft, non tender, non distended, bowel sounds +, no guarding Extremities: no edema, no cyanosis, pulses palpable bilaterally DP and PT Neuro: Grossly nonfocal  Discharge Instructions  Discharge Instructions    Diet - low sodium heart healthy    Complete by:  As directed    Increase activity slowly    Complete by:  As directed      Allergies as of 02/03/2017   No Known Allergies     Medication List    STOP taking these medications   lisinopril-hydrochlorothiazide 20-25 MG tablet Commonly known as:  PRINZIDE,ZESTORETIC     TAKE these medications   acetaminophen-codeine 300-30 MG tablet Commonly known as:  TYLENOL #3 Take 1 tablet by mouth daily as  needed.   cetirizine 10 MG tablet Commonly known as:  ZYRTEC Take 10 mg by mouth daily.   cyclobenzaprine 10 MG tablet Commonly known as:  FLEXERIL Take 10 mg by mouth daily as needed for muscle spasms.   doxepin 50 MG capsule Commonly known as:   SINEQUAN Take 50 mg by mouth at bedtime.   gabapentin 300 MG capsule Commonly known as:  NEURONTIN Take 300 mg by mouth daily.   PROAIR HFA 108 (90 Base) MCG/ACT inhaler Generic drug:  albuterol Inhale 2 puffs into the lungs every 6 (six) hours as needed for wheezing.            Discharge Care Instructions        Start     Ordered   02/03/17 0000  Increase activity slowly     02/03/17 0837   02/03/17 0000  Diet - low sodium heart healthy     02/03/17 1610     Follow-up Information    Fleet Contras, MD Follow up.   Specialty:  Internal Medicine Contact information: [address] [place] [address] [phone]            The results of significant diagnostics from this hospitalization (including imaging, microbiology, ancillary and laboratory) are listed below for reference.     Microbiology: No results found for this or any previous visit (from the past 240 hour(s)).   Labs: Basic Metabolic Panel:  Recent Labs Lab 02/02/17 0034 02/02/17 0906 02/02/17 1500 02/03/17 0509  NA 131* 130*  --  137  K 4.2 4.3  --  4.5  CL 89* 92*  --  102  CO2 21* 21*  --  27  GLUCOSE 85 99  --  91  BUN 76* 77*  --  49*  CREATININE 8.65* 8.39* 6.11* 1.46*  CALCIUM 8.8* 8.1*  --  8.6*  MG  --   --  3.1*  --   PHOS  --   --  7.7*  --    Liver Function Tests:  Recent Labs Lab 02/02/17 0521 02/03/17 0509  AST 100* 54*  ALT 60 46  ALKPHOS 65 59  BILITOT 0.8 1.0  PROT 8.1 7.2  ALBUMIN 4.1 3.6  CBC:  Recent Labs Lab 02/02/17 0034 02/03/17 0509  WBC 14.1* 8.0  HGB 14.7 13.2  HCT 41.4 38.1*  MCV 95.6 94.8  PLT 294 254   Cardiac Enzymes:  Recent Labs Lab 02/02/17 1509  CKTOTAL 1,224*  CKMB 18.2*    CBG:  Recent Labs Lab 02/02/17 0253  GLUCAP 83    SIGNED: Time coordinating discharge: 60 minutes  Debbora Presto, MD  [facility] 02/03/2017, 8:38 AM Pager [phone]  If 7PM-7AM, please contact  night-coverage [email or web address] Password [email or web address]

## 2017-02-03 NOTE — Discharge Instructions (Signed)
Acute Kidney Injury, Adult Acute kidney injury is a sudden worsening of kidney function. The kidneys are organs that have several jobs. They filter the blood to remove waste products and extra fluid. They also maintain a healthy balance of minerals and hormones in the body, which helps control blood pressure and keep bones strong. With this condition, your kidneys do not do their jobs as well as they should. This condition ranges from mild to severe. Over time it may develop into long-lasting (chronic) kidney disease. Early detection and treatment may prevent acute kidney injury from developing into a chronic condition. What are the causes? Common causes of this condition include:  A problem with blood flow to the kidneys. This may be caused by:  Low blood pressure (hypotension) or shock.  Blood loss.  Heart and blood vessel (cardiovascular) disease.  Severe burns.  Liver disease.  Direct damage to the kidneys. This may be caused by:  Certain medicines.  A kidney infection.  Poisoning.  Being around or in contact with toxic substances.  A surgical wound.  A hard, direct hit to the kidney area.  A sudden blockage of urine flow. This may be caused by:  Cancer.  Kidney stones.  An enlarged prostate in males. What are the signs or symptoms? Symptoms of this condition may not be obvious until the condition becomes severe. Symptoms of this condition can include:  Tiredness (lethargy), or difficulty staying awake.  Nausea or vomiting.  Swelling (edema) of the face, legs, ankles, or feet.  Problems with urination, such as:  Abdominal pain, or pain along the side of your stomach (flank).  Decreased urine production.  Decrease in the force of urine flow.  Muscle twitches and cramps, especially in the legs.  Confusion or trouble concentrating.  Loss of appetite.  Fever. How is this diagnosed? This condition may be diagnosed with tests, including:  Blood  tests.  Urine tests.  Imaging tests.  A test in which a sample of tissue is removed from the kidneys to be examined under a microscope (kidney biopsy). How is this treated? Treatment for this condition depends on the cause and how severe the condition is. In mild cases, treatment may not be needed. The kidneys may heal on their own. In more severe cases, treatment will involve:  Treating the cause of the kidney injury. This may involve changing any medicines you are taking or adjusting your dosage.  Fluids. You may need specialized IV fluids to balance your body's needs.  Having a catheter placed to drain urine and prevent blockages.  Preventing problems from occurring. This may mean avoiding certain medicines or procedures that can cause further injury to the kidneys. In some cases treatment may also require:  A procedure to remove toxic wastes from the body (dialysis or continuous renal replacement therapy - CRRT).  Surgery. This may be done to repair a torn kidney, or to remove the blockage from the urinary system. Follow these instructions at home: Medicines   Take over-the-counter and prescription medicines only as told by your health care provider.  Do not take any new medicines without your health care provider's approval. Many medicines can worsen your kidney damage.  Do not take any vitamin and mineral supplements without your health care provider's approval. Many nutritional supplements can worsen your kidney damage. Lifestyle   If your health care provider prescribed changes to your diet, follow them. You may need to decrease the amount of protein you eat.  Achieve and maintain a   healthy weight. If you need help with this, ask your health care provider.  Start or continue an exercise plan. Try to exercise at least 30 minutes a day, 5 days a week.  Do not use any tobacco products, such as cigarettes, chewing tobacco, and e-cigarettes. If you need help quitting, ask  your health care provider. General instructions   Keep track of your blood pressure. Report changes in your blood pressure as told by your health care provider.  Stay up to date with immunizations. Ask your health care provider which immunizations you need.  Keep all follow-up visits as told by your health care provider. This is important. Where to find more information:  American Association of Kidney Patients: www.aakp.org  National Kidney Foundation: www.kidney.org  [employer]: www.akfinc.org  [employer] [facility]:  www.lifeoptions.org  www.kidneyschool.org Contact a health care provider if:  Your symptoms get worse.  You develop new symptoms. Get help right away if:  You develop symptoms of worsening kidney disease, which include:  Headaches.  Abnormally dark or light skin.  Easy bruising.  Frequent hiccups.  Chest pain.  Shortness of breath.  End of menstruation in women.  Seizures.  Confusion or altered mental status.  Abdominal or back pain.  Itchiness.  You have a fever.  Your body is producing less urine.  You have pain or bleeding when you urinate. Summary  Acute kidney injury is a sudden worsening of kidney function.  Acute kidney injury can be caused by problems with blood flow to the kidneys, direct damage to the kidneys, and sudden blockage of urine flow.  Symptoms of this condition may not be obvious until it becomes severe. Symptoms may include edema, lethargy, confusion, nausea or vomiting, and problems passing urine.  This condition can usually be diagnosed with blood tests, urine tests, and imaging tests. Sometimes a kidney biopsy is done to diagnose this condition.  Treatment for this condition often involves treating the underlying cause. It is treated with fluids, medicines, dialysis, diet changes, or surgery. This information is not intended to replace advice given to you by your health care provider.  Make sure you discuss any questions you have with your health care provider. Document Released: 12/03/2010 Document Revised: 05/10/2016 Document Reviewed: 05/10/2016 [employer] Interactive Patient Education  2017 [employer]  

## 2017-02-03 NOTE — Progress Notes (Signed)
Pt discharged, walked out with family. Went over discharge summery with patient and family. Both parties demonstrated understanding. IV removed.

## 2017-02-04 LAB — HIV ANTIBODY (ROUTINE TESTING W REFLEX): HIV Screen 4th Generation wRfx: NONREACTIVE

## 2017-02-04 LAB — PROTEIN ELECTROPHORESIS, SERUM
A/G Ratio: 0.9 (ref 0.7–1.7)
ALBUMIN ELP: 3.2 g/dL (ref 2.9–4.4)
ALPHA-1-GLOBULIN: 0.3 g/dL (ref 0.0–0.4)
ALPHA-2-GLOBULIN: 1 g/dL (ref 0.4–1.0)
Beta Globulin: 1 g/dL (ref 0.7–1.3)
GAMMA GLOBULIN: 1.2 g/dL (ref 0.4–1.8)
Globulin, Total: 3.6 g/dL (ref 2.2–3.9)
TOTAL PROTEIN ELP: 6.8 g/dL (ref 6.0–8.5)

## 2017-02-04 LAB — FOLATE RBC
Folate, Hemolysate: 524.7 ng/mL
Folate, RBC: 1349 ng/mL (ref >498)
Hematocrit: 38.9 % (ref 37.5–51.0)

## 2017-02-04 LAB — VITAMIN D 25 HYDROXY (VIT D DEFICIENCY, FRACTURES): Vit D, 25-Hydroxy: 30.5 ng/mL (ref 30.0–100.0)

## 2017-02-04 LAB — PARATHYROID HORMONE, INTACT (NO CA): PTH: 97 pg/mL — AB (ref 15–65)

## 2017-02-10 LAB — UIFE/LIGHT CHAINS/TP QN, 24-HR UR
% BETA, Urine: 18.2 %
ALPHA 1 URINE: 2.9 %
Albumin, U: 42 %
Alpha 2, Urine: 11.3 %
FREE LT CHN EXCR RATE: 380 mg/L — AB (ref 1.35–24.19)
Free Kappa/Lambda Ratio: 5.15 (ref 2.04–10.37)
Free Lambda Lt Chains,Ur: 73.8 mg/L — ABNORMAL HIGH (ref 0.24–6.66)
GAMMA GLOBULIN URINE: 25.7 %
TOTAL PROTEIN, URINE-UPE24: 75.1 mg/dL

## 2017-12-18 IMAGING — CT CT HEAD W/O CM
3 of 8 series · 14 of 47 positions shown, 17 images · non-contrast
Comparison: 06/21/2003 CT head and cervical spine.

CLINICAL DATA: 60 y/o  M; status post fall with head injury.

EXAM:
CT HEAD WITHOUT CONTRAST
CT CERVICAL SPINE WITHOUT CONTRAST
TECHNIQUE: Multidetector CT imaging of the head and cervical spine was
performed following the standard protocol without intravenous
contrast. Multiplanar CT image reconstructions of the cervical spine
were also generated.

[Series 9: coronal · coronal · 0.30mm/px · 3 of 67 slices shown]
[im 25/67  brain]
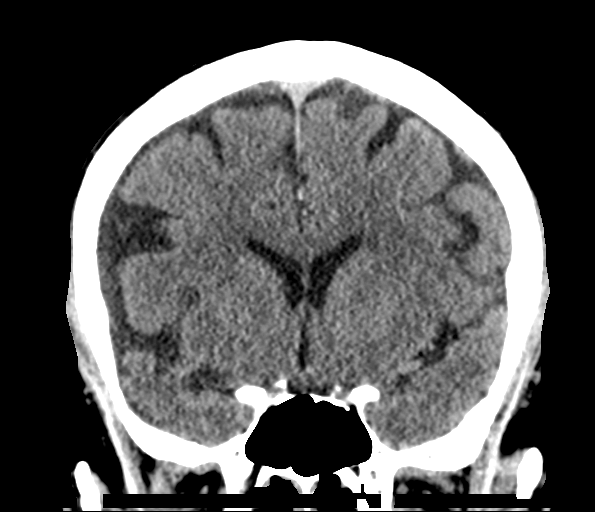
[im 34/67  brain]
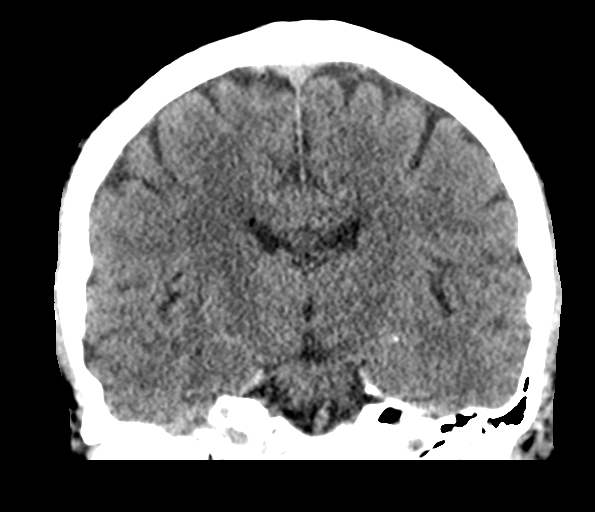
[im 42/67  brain]
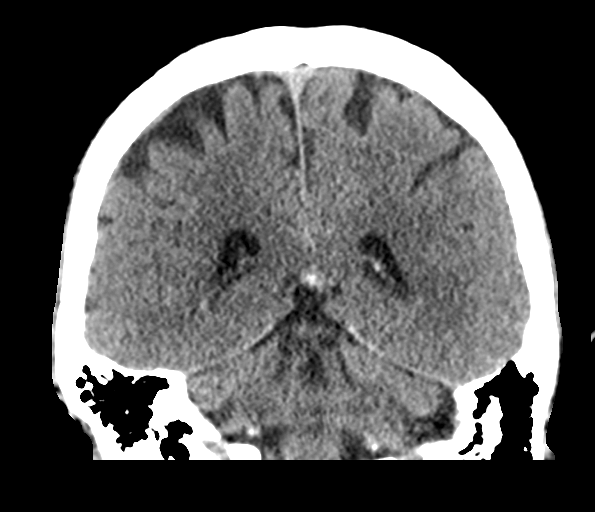

[Series 10: sagittal · sagittal · 0.32mm/px · 2 of 51 slices shown]
[im 17/51  brain]
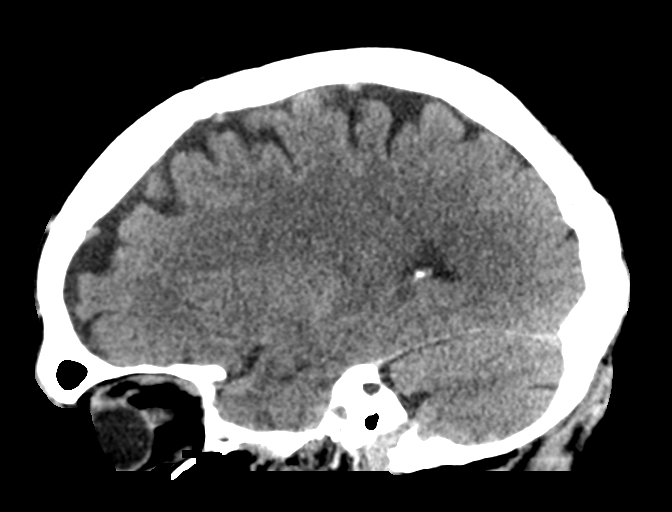
[im 34/51  brain]
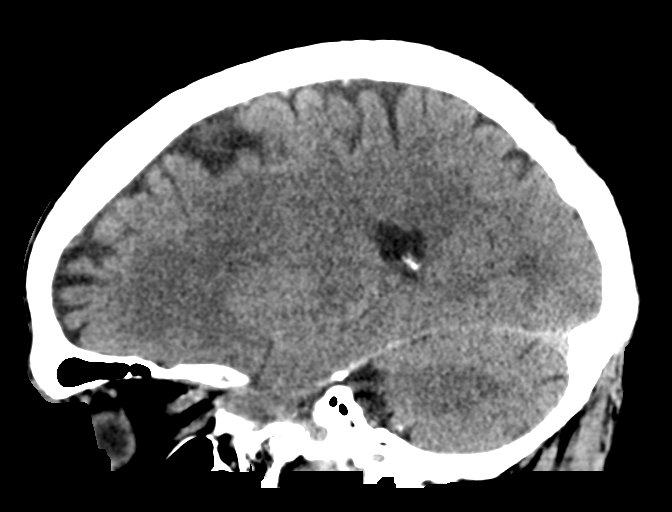

[Series 11: axial recon · axial · 0.23mm/px · z∈[-329,-206]mm · 9 of 95 slices shown, 12 images]
[im 10/95  brain]
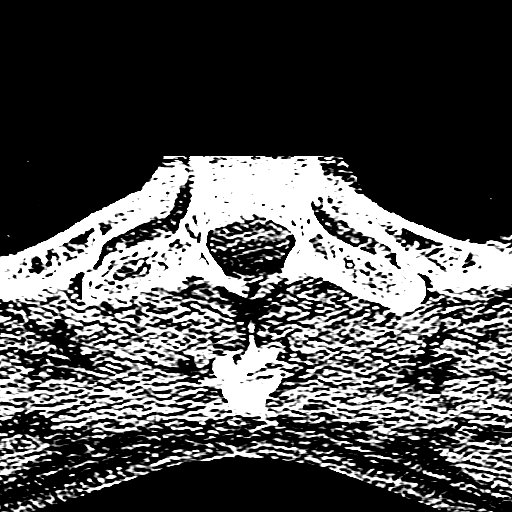
[im 10/95  bone]
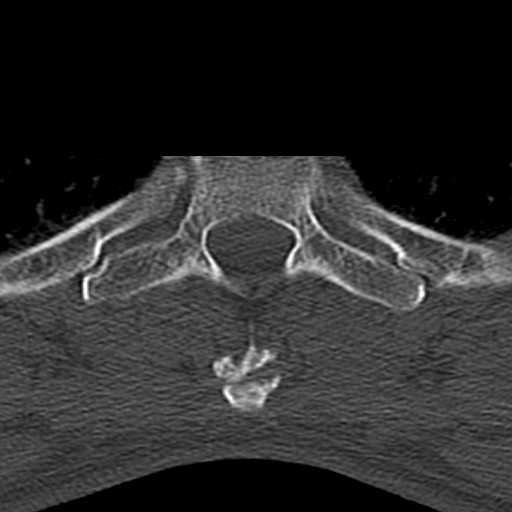
[im 19/95  brain]
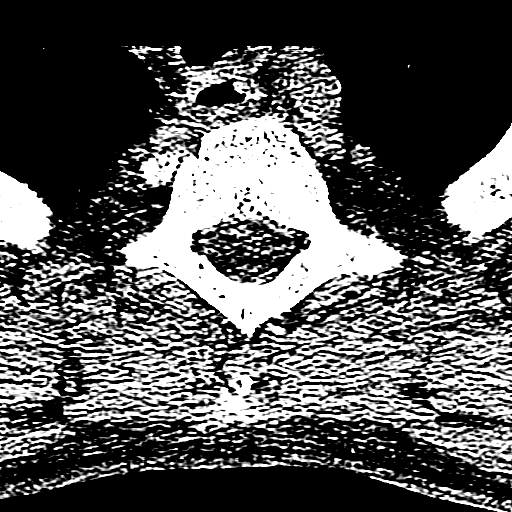
[im 29/95  brain]
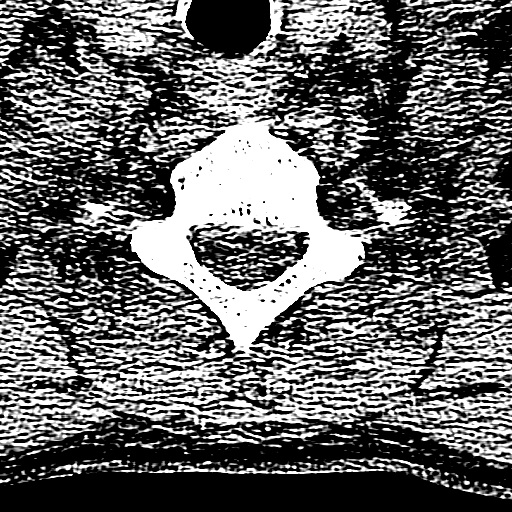
[im 38/95  brain]
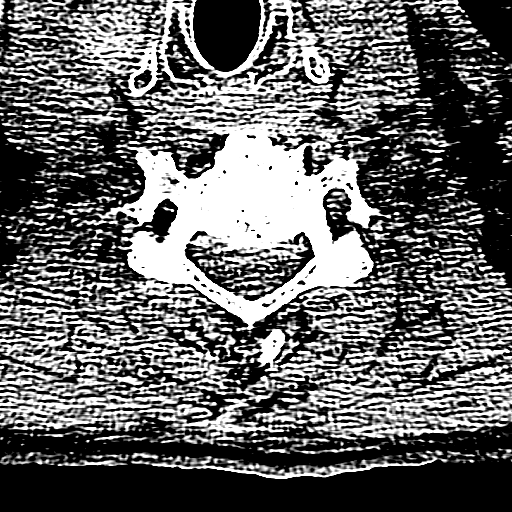
[im 48/95  brain]
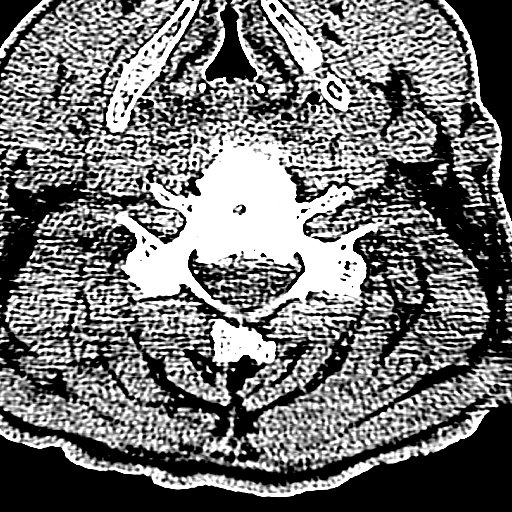
[im 48/95  bone]
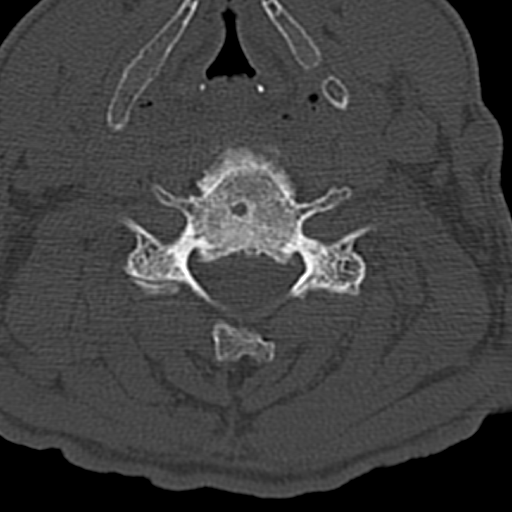
[im 57/95  brain]
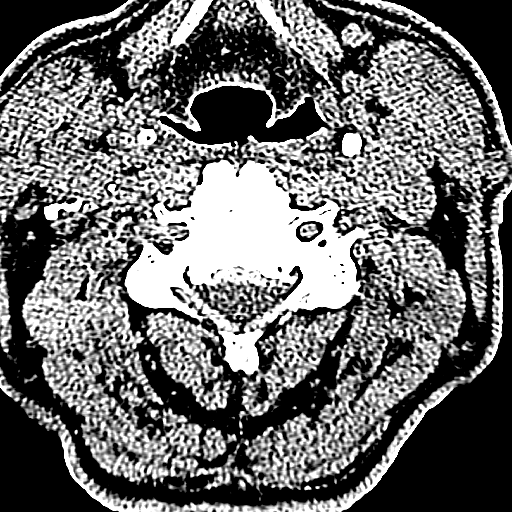
[im 66/95  brain]
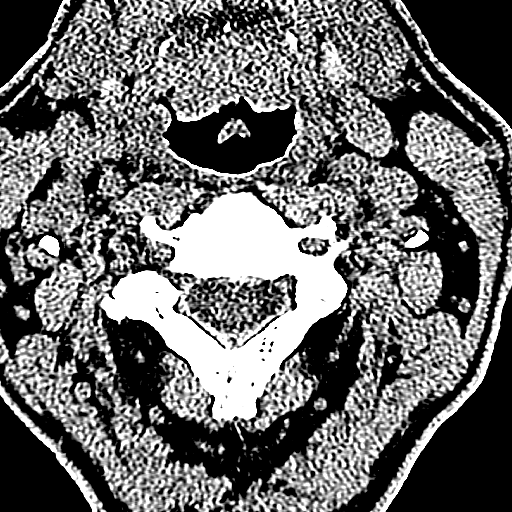
[im 76/95  brain]
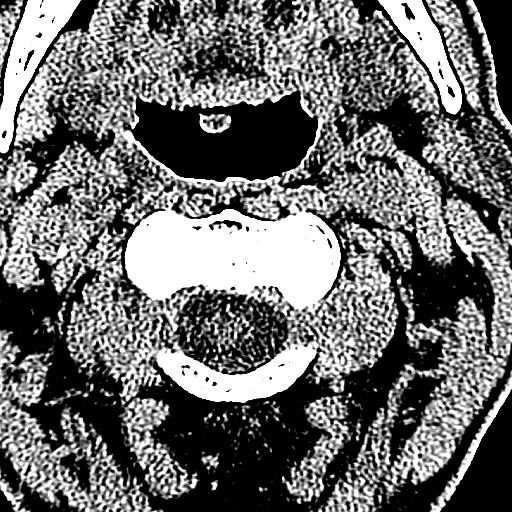
[im 85/95  brain]
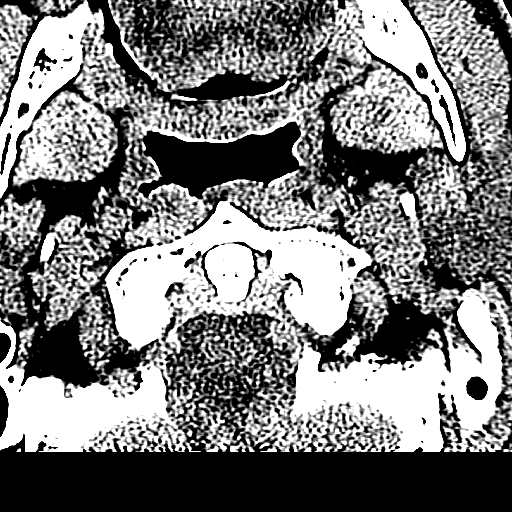
[im 85/95  bone]
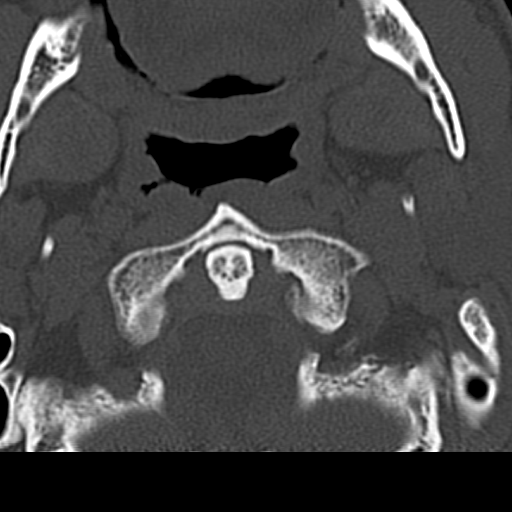

[14 of 47 positions shown; findings below may reference images not displayed]

FINDINGS: CT HEAD FINDINGS

Brain: No evidence of acute infarction, hemorrhage, hydrocephalus,
extra-axial collection or mass lesion/mass effect. Small lucency in
right cerebellar hemisphere, probably a chronic lacunar infarction.

Vascular: Moderate calcific atherosclerosis of the carotid siphons.
No hyperdense vessel identified.

Skull: Normal. Negative for fracture or focal lesion.

Sinuses/Orbits: No acute finding.

Other: None.

CT CERVICAL SPINE FINDINGS

Alignment: Straightening of cervical lordosis.  No listhesis.

Skull base and vertebrae: No acute fracture. No primary bone lesion
or focal pathologic process.

Soft tissues and spinal canal: No prevertebral fluid or swelling. No
visible canal hematoma.

Disc levels: Advanced cervical degenerative changes with discogenic
disease greatest at the C5 through C7 levels. Mild facet arthrosis.
Uncovertebral hypertrophy encroaches on the neural foramen
bilaterally greatest at the C3-4 and C5-6 levels. Multilevel mild
bony stenosis of the spinal canal.

Upper chest: Negative.

Other: Negative.
IMPRESSION: 1. No acute intracranial abnormality identified.
2. No displaced calvarial fracture.
3. No acute fracture or dislocation of the cervical spine.
4. Subcentimeter lucency in right cerebellar hemisphere, likely
chronic lacunar infarct.
5. Advanced cervical spine spondylosis with predominantly discogenic
degenerative disease. No high-grade bony canal stenosis.

By: Tarcsa Dargai M.D.

## 2018-07-04 DEATH — deceased
